# Patient Record
Sex: Female | Born: 1945 | Race: White | Hispanic: No | Marital: Married | Smoking: Never smoker
Health system: Southern US, Community
[De-identification: ages and names within clinical notes are randomized; demographics above are authoritative.]

## PROBLEM LIST (undated history)

## (undated) DIAGNOSIS — M75100 Unspecified rotator cuff tear or rupture of unspecified shoulder, not specified as traumatic: Secondary | ICD-10-CM

## (undated) DIAGNOSIS — M1990 Unspecified osteoarthritis, unspecified site: Secondary | ICD-10-CM

## (undated) DIAGNOSIS — E785 Hyperlipidemia, unspecified: Secondary | ICD-10-CM

## (undated) DIAGNOSIS — R413 Other amnesia: Secondary | ICD-10-CM

## (undated) DIAGNOSIS — K219 Gastro-esophageal reflux disease without esophagitis: Secondary | ICD-10-CM

## (undated) DIAGNOSIS — D649 Anemia, unspecified: Secondary | ICD-10-CM

## (undated) DIAGNOSIS — Z9889 Other specified postprocedural states: Secondary | ICD-10-CM

## (undated) DIAGNOSIS — H8110 Benign paroxysmal vertigo, unspecified ear: Secondary | ICD-10-CM

## (undated) DIAGNOSIS — F411 Generalized anxiety disorder: Secondary | ICD-10-CM

## (undated) DIAGNOSIS — Z860100 Personal history of colon polyps, unspecified: Secondary | ICD-10-CM

## (undated) DIAGNOSIS — M810 Age-related osteoporosis without current pathological fracture: Secondary | ICD-10-CM

## (undated) DIAGNOSIS — R519 Headache, unspecified: Secondary | ICD-10-CM

## (undated) HISTORY — PX: CARPAL TUNNEL RELEASE: SHX101

## (undated) HISTORY — PX: ABDOMINAL HYSTERECTOMY: SHX81

## (undated) HISTORY — PX: TONSILLECTOMY: SUR1361

## (undated) HISTORY — PX: OTHER SURGICAL HISTORY: SHX169

## (undated) HISTORY — PX: COLONOSCOPY: SHX174

---

## 2004-09-26 ENCOUNTER — Ambulatory Visit: Payer: Self-pay | Admitting: Unknown Physician Specialty

## 2005-02-17 ENCOUNTER — Emergency Department: Payer: Self-pay | Admitting: Emergency Medicine

## 2005-09-30 ENCOUNTER — Ambulatory Visit: Payer: Self-pay | Admitting: Unknown Physician Specialty

## 2005-11-26 ENCOUNTER — Ambulatory Visit: Payer: Self-pay | Admitting: Urology

## 2006-10-16 ENCOUNTER — Ambulatory Visit: Payer: Self-pay | Admitting: Unknown Physician Specialty

## 2007-01-03 IMAGING — CT CT PELVIS W/ CM
1 series · 16 of 29 positions shown, 20 images · non-contrast
Comparison: none

REASON FOR EXAM: pelvic pain
COMMENTS:

[Series 2: pelvis · axial · 0.74mm/px · z∈[-698,-490]mm · 16 of 29 slices shown, 20 images]
[im 2/29  soft-tissue]
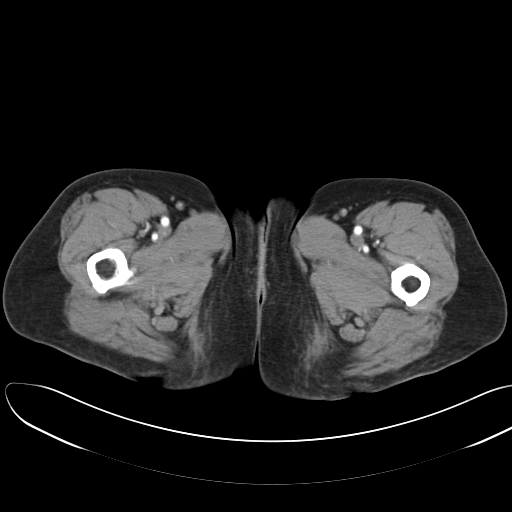
[im 2/29  bone]
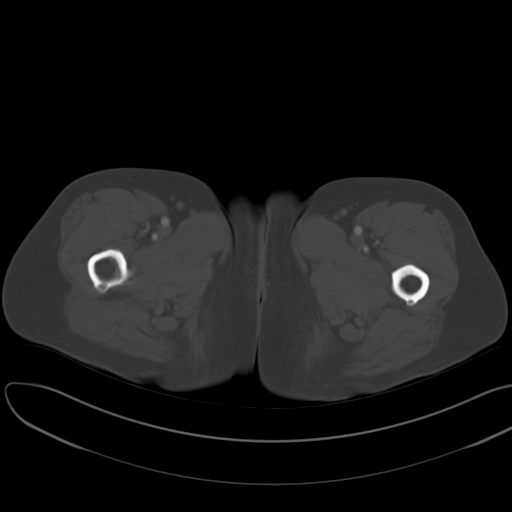
[im 4/29  soft-tissue]
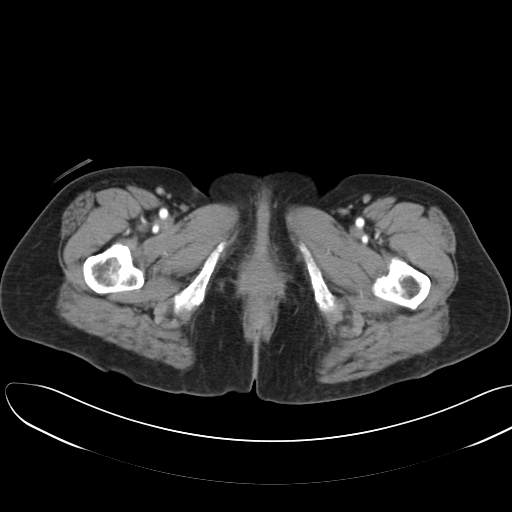
[im 6/29  soft-tissue]
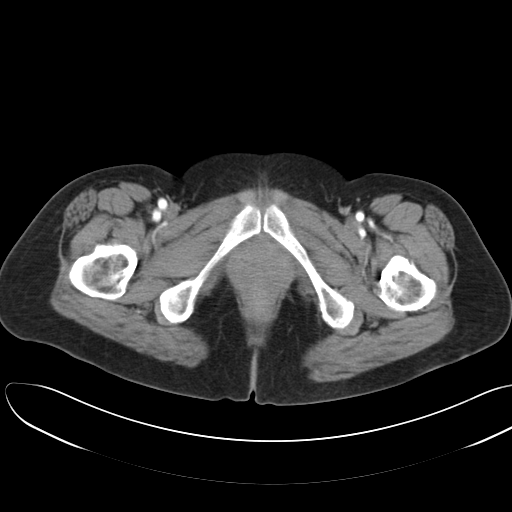
[im 8/29  soft-tissue]
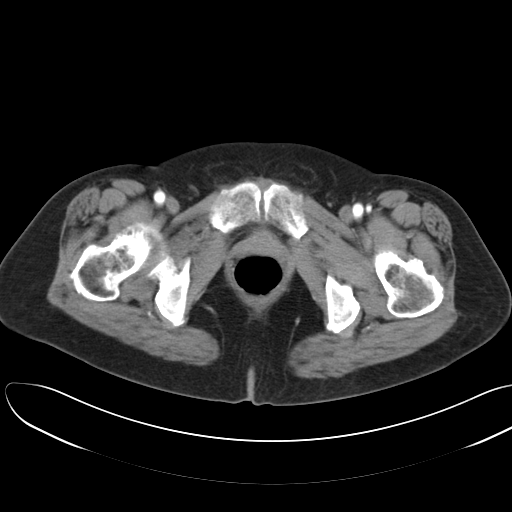
[im 10/29  soft-tissue]
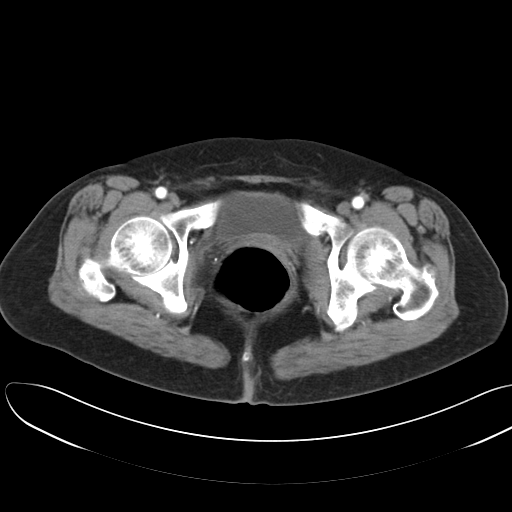
[im 12/29  soft-tissue]
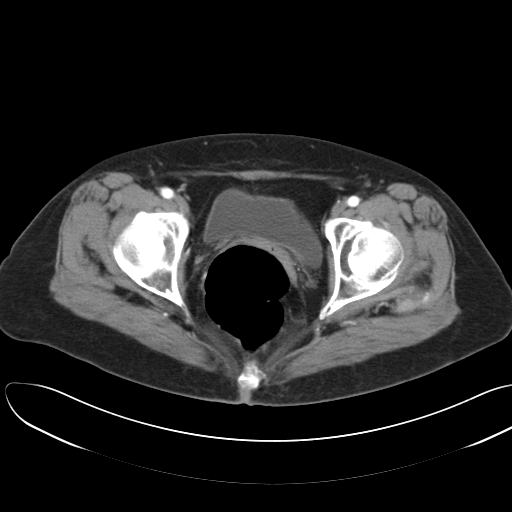
[im 14/29  soft-tissue]
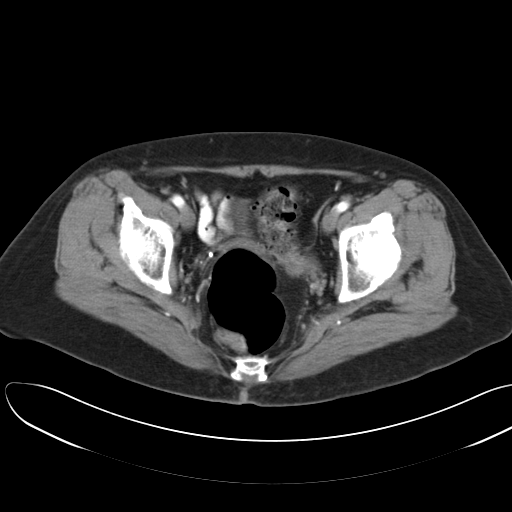
[im 16/29  soft-tissue]
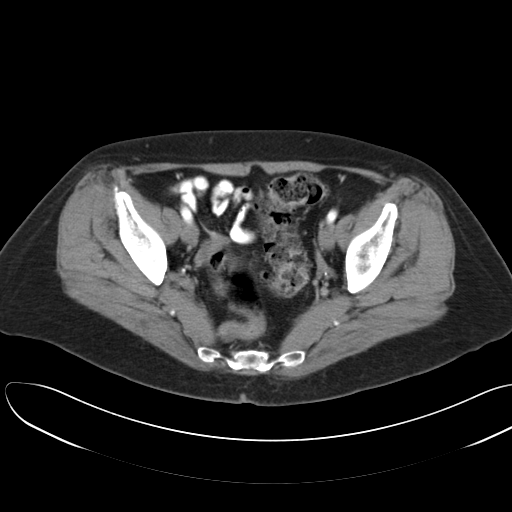
[im 18/29  soft-tissue]
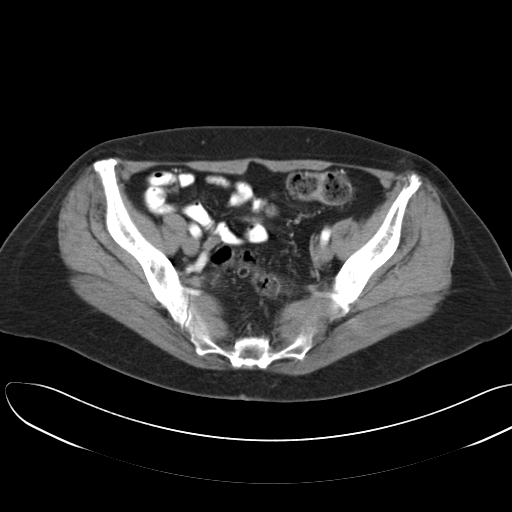
[im 18/29  bone]
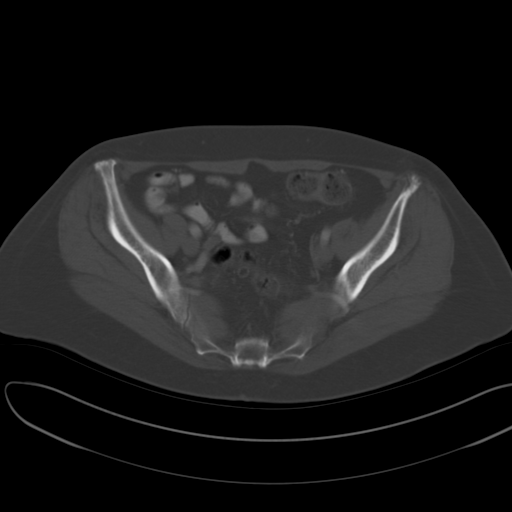
[im 20/29  soft-tissue]
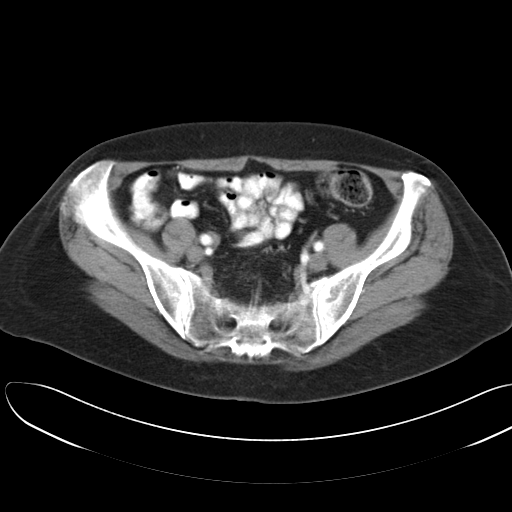
[im 22/29  soft-tissue]
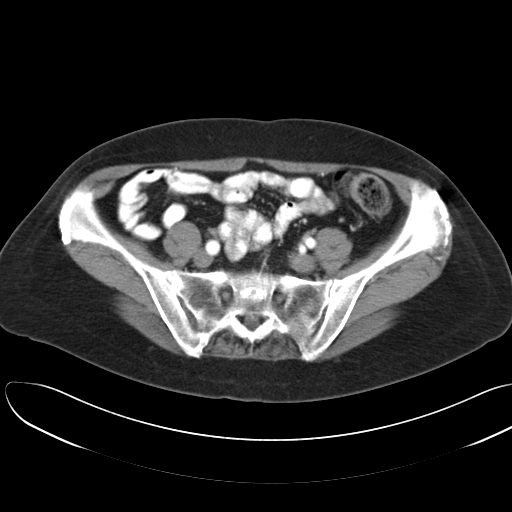
[im 24/29  soft-tissue]
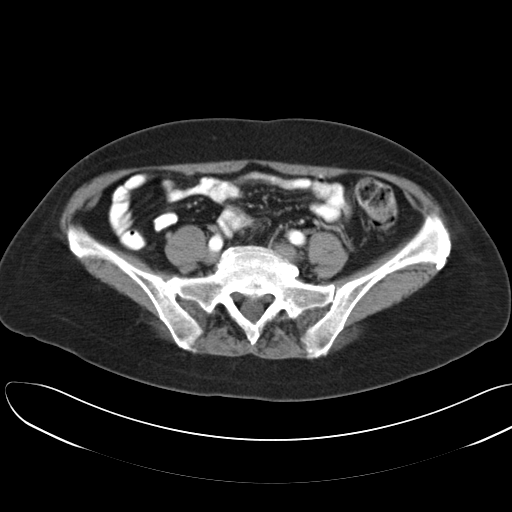
[im 25/29  lung]
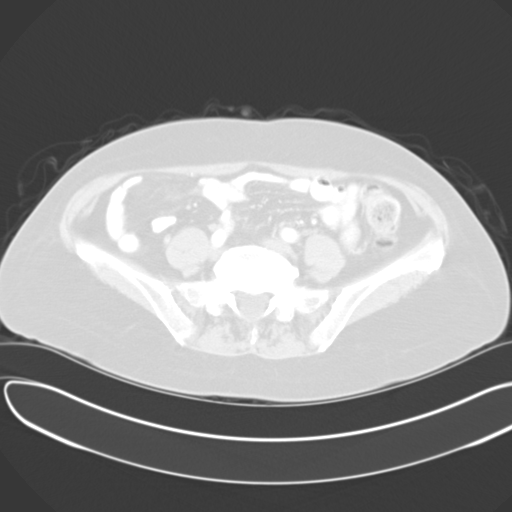
[im 26/29  soft-tissue]
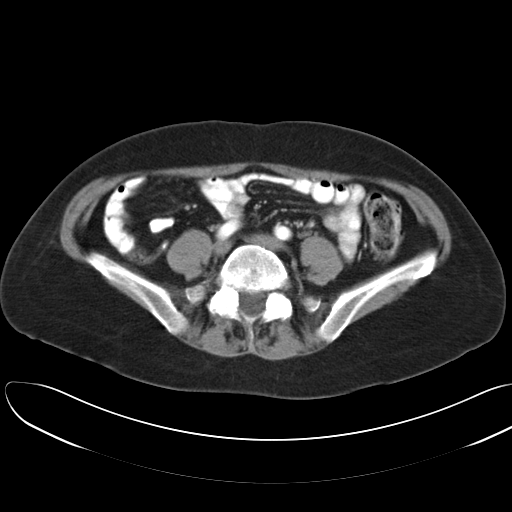
[im 26/29  lung]
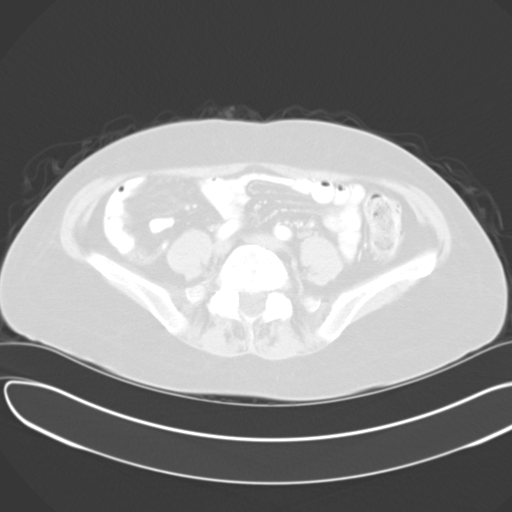
[im 27/29  lung]
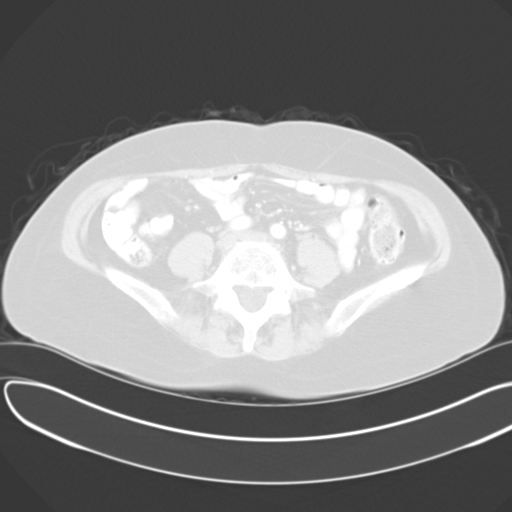
[im 28/29  soft-tissue]
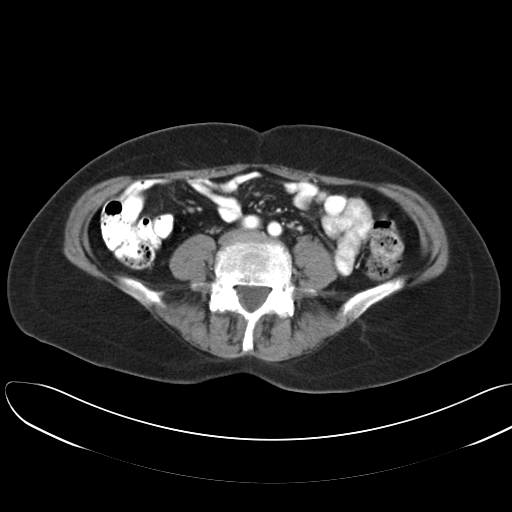
[im 28/29  lung]
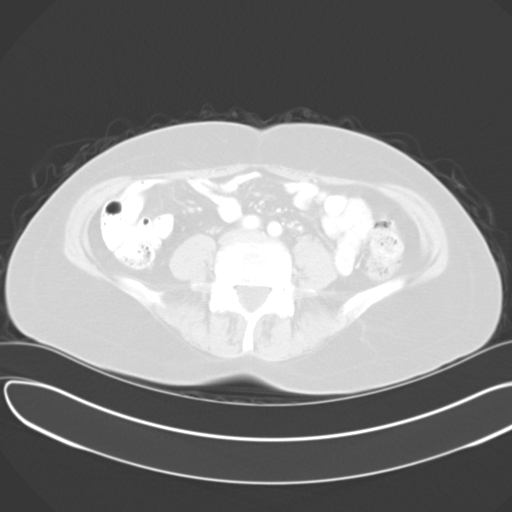

[16 of 29 positions shown; findings below may reference images not displayed]

PROCEDURE:     CT  - CT PELVIS STANDARD W  - November 26, 2005  [DATE]

RESULT:     Spiral 8 mm sections were obtained from the iliac crest through
the pubic symphysis status post intravenous administration of 100 ml of
Isovue 370 and oral contrast.

There does not appear to be evidence of pelvic free fluid, drainable
loculated fluid collections, masses or adenopathy.  Diverticulosis is
demonstrated within the sigmoid colon, mild to moderate, without CT evidence
of diverticulitis.
IMPRESSION: 1)Incidental note is made of diverticulosis within the sigmoid colon,
otherwise, no evidence of a pelvic free fluid, drainable loculated fluid
collections, masses or adenopathy.

## 2007-11-03 ENCOUNTER — Ambulatory Visit: Payer: Self-pay | Admitting: Unknown Physician Specialty

## 2007-11-17 ENCOUNTER — Ambulatory Visit: Payer: Self-pay | Admitting: Unknown Physician Specialty

## 2007-12-03 ENCOUNTER — Ambulatory Visit: Payer: Self-pay | Admitting: Unknown Physician Specialty

## 2008-04-15 ENCOUNTER — Ambulatory Visit: Payer: Self-pay | Admitting: Internal Medicine

## 2008-11-23 ENCOUNTER — Ambulatory Visit: Payer: Self-pay | Admitting: Unknown Physician Specialty

## 2009-11-26 ENCOUNTER — Ambulatory Visit: Payer: Self-pay | Admitting: Unknown Physician Specialty

## 2010-09-18 ENCOUNTER — Ambulatory Visit: Payer: Self-pay | Admitting: Unknown Physician Specialty

## 2010-12-24 ENCOUNTER — Ambulatory Visit: Payer: Self-pay | Admitting: Unknown Physician Specialty

## 2011-12-29 ENCOUNTER — Ambulatory Visit: Payer: Self-pay | Admitting: Unknown Physician Specialty

## 2013-10-21 ENCOUNTER — Ambulatory Visit: Payer: Self-pay | Admitting: Family Medicine

## 2013-11-03 ENCOUNTER — Ambulatory Visit: Payer: Self-pay | Admitting: Unknown Physician Specialty

## 2013-11-04 LAB — PATHOLOGY REPORT

## 2013-12-13 ENCOUNTER — Ambulatory Visit: Payer: Self-pay | Admitting: Family Medicine

## 2014-11-28 ENCOUNTER — Ambulatory Visit: Payer: Self-pay | Admitting: Family Medicine

## 2016-10-10 ENCOUNTER — Other Ambulatory Visit: Payer: Self-pay | Admitting: Family Medicine

## 2016-10-10 DIAGNOSIS — Z1231 Encounter for screening mammogram for malignant neoplasm of breast: Secondary | ICD-10-CM

## 2016-11-05 ENCOUNTER — Encounter (HOSPITAL_COMMUNITY): Payer: Self-pay

## 2016-11-05 ENCOUNTER — Ambulatory Visit
Admission: RE | Admit: 2016-11-05 | Discharge: 2016-11-05 | Disposition: A | Discharge status: Home / Self Care | Payer: Medicare Other | Source: Ambulatory Visit | Attending: Family Medicine | Admitting: Family Medicine

## 2016-11-05 DIAGNOSIS — Z1231 Encounter for screening mammogram for malignant neoplasm of breast: Secondary | ICD-10-CM | POA: Insufficient documentation

## 2017-11-10 ENCOUNTER — Other Ambulatory Visit: Payer: Self-pay | Admitting: Family Medicine

## 2017-11-10 DIAGNOSIS — Z1231 Encounter for screening mammogram for malignant neoplasm of breast: Secondary | ICD-10-CM

## 2017-12-01 ENCOUNTER — Encounter: Payer: Self-pay | Admitting: Radiology

## 2017-12-01 ENCOUNTER — Ambulatory Visit
Admission: RE | Admit: 2017-12-01 | Discharge: 2017-12-01 | Disposition: A | Discharge status: Home / Self Care | Payer: Medicare Other | Source: Ambulatory Visit | Attending: Family Medicine | Admitting: Family Medicine

## 2017-12-01 DIAGNOSIS — Z1231 Encounter for screening mammogram for malignant neoplasm of breast: Secondary | ICD-10-CM | POA: Insufficient documentation

## 2017-12-28 ENCOUNTER — Other Ambulatory Visit: Payer: Self-pay | Admitting: Unknown Physician Specialty

## 2017-12-28 DIAGNOSIS — M7541 Impingement syndrome of right shoulder: Secondary | ICD-10-CM

## 2018-01-04 ENCOUNTER — Ambulatory Visit
Admission: RE | Admit: 2018-01-04 | Discharge: 2018-01-04 | Disposition: A | Discharge status: Home / Self Care | Payer: Medicare Other | Source: Ambulatory Visit | Attending: Unknown Physician Specialty | Admitting: Unknown Physician Specialty

## 2018-01-04 DIAGNOSIS — X58XXXA Exposure to other specified factors, initial encounter: Secondary | ICD-10-CM | POA: Insufficient documentation

## 2018-01-04 DIAGNOSIS — S46811A Strain of other muscles, fascia and tendons at shoulder and upper arm level, right arm, initial encounter: Secondary | ICD-10-CM | POA: Insufficient documentation

## 2018-01-04 DIAGNOSIS — M7541 Impingement syndrome of right shoulder: Secondary | ICD-10-CM | POA: Insufficient documentation

## 2018-10-28 ENCOUNTER — Other Ambulatory Visit: Payer: Self-pay | Admitting: Family Medicine

## 2018-10-28 DIAGNOSIS — Z1231 Encounter for screening mammogram for malignant neoplasm of breast: Secondary | ICD-10-CM

## 2018-12-07 ENCOUNTER — Ambulatory Visit
Admission: RE | Admit: 2018-12-07 | Discharge: 2018-12-07 | Disposition: A | Discharge status: Home / Self Care | Payer: Medicare Other | Source: Ambulatory Visit | Attending: Family Medicine | Admitting: Family Medicine

## 2018-12-07 DIAGNOSIS — Z1231 Encounter for screening mammogram for malignant neoplasm of breast: Secondary | ICD-10-CM | POA: Diagnosis not present

## 2018-12-29 ENCOUNTER — Ambulatory Visit
Admission: RE | Admit: 2018-12-29 | Payer: Medicare Other | Source: Ambulatory Visit | Admitting: Unknown Physician Specialty

## 2018-12-29 ENCOUNTER — Encounter: Admission: RE | Payer: Self-pay | Source: Ambulatory Visit

## 2018-12-29 SURGERY — COLONOSCOPY WITH PROPOFOL
Anesthesia: General

## 2019-06-03 ENCOUNTER — Encounter: Payer: Self-pay | Admitting: *Deleted

## 2019-06-03 ENCOUNTER — Other Ambulatory Visit: Payer: Self-pay

## 2019-06-03 ENCOUNTER — Other Ambulatory Visit
Admission: RE | Admit: 2019-06-03 | Discharge: 2019-06-03 | Disposition: A | Discharge status: Home / Self Care | Payer: Medicare Other | Source: Ambulatory Visit | Attending: Internal Medicine | Admitting: Internal Medicine

## 2019-06-03 DIAGNOSIS — Z01812 Encounter for preprocedural laboratory examination: Secondary | ICD-10-CM | POA: Insufficient documentation

## 2019-06-03 DIAGNOSIS — Z20828 Contact with and (suspected) exposure to other viral communicable diseases: Secondary | ICD-10-CM | POA: Diagnosis not present

## 2019-06-03 LAB — SARS CORONAVIRUS 2 (TAT 6-24 HRS): SARS Coronavirus 2: NEGATIVE

## 2019-06-07 ENCOUNTER — Encounter: Payer: Self-pay | Admitting: *Deleted

## 2019-06-07 ENCOUNTER — Encounter
Admission: RE | Disposition: A | Discharge status: Home / Self Care | Payer: Self-pay | Source: Home / Self Care | Attending: Internal Medicine

## 2019-06-07 ENCOUNTER — Ambulatory Visit
Admission: RE | Admit: 2019-06-07 | Discharge: 2019-06-07 | Disposition: A | Discharge status: Home / Self Care | Payer: Medicare Other | Attending: Internal Medicine | Admitting: Internal Medicine

## 2019-06-07 ENCOUNTER — Ambulatory Visit: Payer: Medicare Other | Admitting: Anesthesiology

## 2019-06-07 DIAGNOSIS — Z1211 Encounter for screening for malignant neoplasm of colon: Secondary | ICD-10-CM | POA: Diagnosis not present

## 2019-06-07 DIAGNOSIS — M1990 Unspecified osteoarthritis, unspecified site: Secondary | ICD-10-CM | POA: Diagnosis not present

## 2019-06-07 DIAGNOSIS — Z888 Allergy status to other drugs, medicaments and biological substances status: Secondary | ICD-10-CM | POA: Diagnosis not present

## 2019-06-07 DIAGNOSIS — D120 Benign neoplasm of cecum: Secondary | ICD-10-CM | POA: Insufficient documentation

## 2019-06-07 DIAGNOSIS — K5730 Diverticulosis of large intestine without perforation or abscess without bleeding: Secondary | ICD-10-CM | POA: Diagnosis not present

## 2019-06-07 DIAGNOSIS — Z800 Family history of malignant neoplasm of digestive organs: Secondary | ICD-10-CM | POA: Diagnosis present

## 2019-06-07 DIAGNOSIS — Z885 Allergy status to narcotic agent status: Secondary | ICD-10-CM | POA: Insufficient documentation

## 2019-06-07 DIAGNOSIS — K641 Second degree hemorrhoids: Secondary | ICD-10-CM | POA: Diagnosis not present

## 2019-06-07 DIAGNOSIS — Z8719 Personal history of other diseases of the digestive system: Secondary | ICD-10-CM | POA: Diagnosis not present

## 2019-06-07 HISTORY — PX: COLONOSCOPY WITH PROPOFOL: SHX5780

## 2019-06-07 HISTORY — DX: Age-related osteoporosis without current pathological fracture: M81.0

## 2019-06-07 HISTORY — DX: Personal history of colon polyps, unspecified: Z86.0100

## 2019-06-07 HISTORY — DX: Unspecified rotator cuff tear or rupture of unspecified shoulder, not specified as traumatic: M75.100

## 2019-06-07 HISTORY — DX: Personal history of colonic polyps: Z86.010

## 2019-06-07 HISTORY — DX: Hyperlipidemia, unspecified: E78.5

## 2019-06-07 HISTORY — DX: Anemia, unspecified: D64.9

## 2019-06-07 HISTORY — DX: Unspecified osteoarthritis, unspecified site: M19.90

## 2019-06-07 HISTORY — DX: Benign paroxysmal vertigo, unspecified ear: H81.10

## 2019-06-07 SURGERY — COLONOSCOPY WITH PROPOFOL
Anesthesia: General

## 2019-06-07 MED ORDER — PROPOFOL 10 MG/ML IV BOLUS
INTRAVENOUS | Status: DC | PRN
  Administered 2019-06-07: 70 mg via INTRAVENOUS

## 2019-06-07 MED ORDER — SODIUM CHLORIDE 0.9 % IV SOLN
INTRAVENOUS | Status: DC
  Administered 2019-06-07: 1000 mL via INTRAVENOUS

## 2019-06-07 MED ORDER — PROPOFOL 500 MG/50ML IV EMUL
INTRAVENOUS | Status: DC | PRN
  Administered 2019-06-07: 140 ug/kg/min via INTRAVENOUS

## 2019-06-07 NOTE — Interval H&P Note (Signed)
History and Physical Interval Note:  06/07/2019 10:12 AM  Carmen Vazquez  has presented today for surgery, with the diagnosis of FAMILY HX.OF COLON CANCER,PERSONAL HX.OF COLON POLYPS.  The various methods of treatment have been discussed with the patient and family. After consideration of risks, benefits and other options for treatment, the patient has consented to  Procedure(s): COLONOSCOPY WITH PROPOFOL (N/A) as a surgical intervention.  The patient's history has been reviewed, patient examined, no change in status, stable for surgery.  I have reviewed the patient's chart and labs.  Questions were answered to the patient's satisfaction.     [place], [place]

## 2019-06-07 NOTE — Transfer of Care (Signed)
Immediate Anesthesia Transfer of Care Note  Patient: ALEEN SOUSA  Procedure(s) Performed: COLONOSCOPY WITH PROPOFOL (N/A )  Patient Location: PACU  Anesthesia Type:General  Level of Consciousness: sedated  Airway & Oxygen Therapy: Patient Spontanous Breathing and Patient connected to nasal cannula oxygen  Post-op Assessment: Report given to RN and Post -op Vital signs reviewed and stable  Post vital signs: Reviewed and stable  Last Vitals:  Vitals Value Taken Time  BP 103/59 06/07/19 1109  Temp 36.3 C 06/07/19 1109  Pulse 65 06/07/19 1109  Resp 14 06/07/19 1109  SpO2 100 % 06/07/19 1109  Vitals shown include unvalidated device data.  Last Pain:  Vitals:   06/07/19 1109  TempSrc:   PainSc: 0-No pain         Complications: No apparent anesthesia complications

## 2019-06-07 NOTE — Anesthesia Postprocedure Evaluation (Signed)
Anesthesia Post Note  Patient: Carmen Vazquez  Procedure(s) Performed: COLONOSCOPY WITH PROPOFOL (N/A )  Patient location during evaluation: PACU Anesthesia Type: General Level of consciousness: awake and alert and oriented Pain management: pain level controlled Vital Signs Assessment: post-procedure vital signs reviewed and stable Respiratory status: spontaneous breathing Cardiovascular status: blood pressure returned to baseline Anesthetic complications: no     Last Vitals:  Vitals:   06/07/19 1118 06/07/19 1128  BP: 117/67 133/73  Pulse: 78 70  Resp: 17 15  Temp:    SpO2: 100% 100%    Last Pain:  Vitals:   06/07/19 1118  TempSrc:   PainSc: 0-No pain                 Joy Haegele

## 2019-06-07 NOTE — H&P (Signed)
Outpatient short stay form Pre-procedure 06/07/2019 10:11 AM Carmen Vazquez K. Alice Reichert, M.D.  Primary Physician: Thereasa Distance, M.D.  Reason for visit:  Family history of colon cancer  History of present illness:  73 year old patient presenting for family history of colon cancer. Patient denies any change in bowel habits, rectal bleeding or involuntary weight loss.    Current Facility-Administered Medications:  .  0.9 %  sodium chloride infusion, , Intravenous, Continuous, Willette Mudry, Benay Pike, MD  Medications Prior to Admission  Medication Sig Dispense Refill Last Dose  . Cranberry 500 MG TABS Take 500 mg by mouth daily.     Marland Kitchen ibuprofen (ADVIL) 200 MG tablet Take 200 mg by mouth every 6 (six) hours as needed.     . Multiple Vitamin (MULTIVITAMIN) tablet Take 1 tablet by mouth daily.     . naproxen (NAPROSYN) 500 MG tablet Take 500 mg by mouth 2 (two) times daily with a meal.     . Omega-3 1000 MG CAPS Take 2,000 mg by mouth daily.     Marland Kitchen senna-docusate (SENOKOT-S) 8.6-50 MG tablet Take 1 tablet by mouth daily.        Allergies  Allergen Reactions  . Codeine Other (See Comments)  . Statins Other (See Comments)    MUSCLE PAIN      Past Medical History:  Diagnosis Date  . Anemia   . Arthritis   . History of colonic polyps   . Hyperlipidemia   . Osteoporosis   . Rotator cuff tear   . Vertigo, benign positional, unspecified laterality     Review of systems:  Otherwise negative.    Physical Exam  Gen: Alert, oriented. Appears stated age.  HEENT: [place]/AT. PERRLA. Lungs: CTA, no wheezes. CV: RR nl S1, S2. Abd: soft, benign, no masses. BS+ Ext: No edema. Pulses 2+    Planned procedures: Proceed with EGD and colonoscopy. The patient understands the nature of the planned procedure, indications, risks, alternatives and potential complications including but not limited to bleeding, infection, perforation, damage to internal organs and possible oversedation/side effects from  anesthesia. The patient agrees and gives consent to proceed.  Please refer to procedure notes for findings, recommendations and patient disposition/instructions.     Nahsir Venezia K. Alice Reichert, M.D. Gastroenterology 06/07/2019  10:11 AM

## 2019-06-07 NOTE — Anesthesia Post-op Follow-up Note (Signed)
Anesthesia QCDR form completed.        

## 2019-06-07 NOTE — Op Note (Signed)
[facility] Gastroenterology Patient Name: Carmen Vazquez Procedure Date: 06/07/2019 7:22 AM MRN: [id number] Account #: [id number] Date of Birth: 1946-07-19 Admit Type: Outpatient Age: 73 Room: [facility] ENDO ROOM 3 Gender: Female Note Status: Finalized Procedure:            Colonoscopy Indications:          Screening in patient at increased risk: Family history                        of 1st-degree relative with colorectal cancer Providers:            Benay Pike. Antrice Pal MD, MD Medicines:            Propofol per Anesthesia Complications:        No immediate complications. Procedure:            Pre-Anesthesia Assessment:                       - The risks and benefits of the procedure and the                        sedation options and risks were discussed with the                        patient. All questions were answered and informed                        consent was obtained.                       - Patient identification and proposed procedure were                        verified prior to the procedure by the nurse. The                        procedure was verified in the procedure room.                       - ASA Grade Assessment: III - A patient with severe                        systemic disease.                       - After reviewing the risks and benefits, the patient                        was deemed in satisfactory condition to undergo the                        procedure.                       After obtaining informed consent, the colonoscope was                        passed under direct vision. Throughout the procedure,                        the patient's blood pressure, pulse, and oxygen  saturations were monitored continuously. The                        Colonoscope was introduced through the anus and                        advanced to the the cecum, identified by appendiceal                        orifice and ileocecal valve. The  colonoscopy was                        performed without difficulty. The patient tolerated the                        procedure well. The quality of the bowel preparation                        was good. The ileocecal valve, appendiceal orifice, and                        rectum were photographed. Findings:      The perianal and digital rectal examinations were normal. Pertinent       negatives include normal sphincter tone and no palpable rectal lesions.      Multiple small and large-mouthed diverticula were found in the sigmoid       colon.      A 4 mm polyp was found in the cecum. The polyp was sessile. The polyp       was removed with a jumbo cold forceps. Resection and retrieval were       complete.      Non-bleeding internal hemorrhoids were found during retroflexion. The       hemorrhoids were Grade II (internal hemorrhoids that prolapse but reduce       spontaneously).      The exam was otherwise without abnormality. Impression:           - Diverticulosis in the sigmoid colon.                       - One 4 mm polyp in the cecum, removed with a jumbo                        cold forceps. Resected and retrieved.                       - Non-bleeding internal hemorrhoids.                       - The examination was otherwise normal. Recommendation:       - Patient has a contact number available for                        emergencies. The signs and symptoms of potential                        delayed complications were discussed with the patient.                        Return to normal activities tomorrow. Written discharge  instructions were provided to the patient.                       - Resume previous diet.                       - Continue present medications.                       - Repeat colonoscopy in 5 years for surveillance.                       - Return to GI office PRN.                       - The findings and recommendations were discussed with                         the patient. Procedure Code(s):    --- Professional ---                       [phone], Colonoscopy, flexible; with biopsy, single or                        multiple Diagnosis Code(s):    --- Professional ---                       K57.30, Diverticulosis of large intestine without                        perforation or abscess without bleeding                       K63.5, Polyp of colon                       K64.1, Second degree hemorrhoids                       Z80.0, Family history of malignant neoplasm of                        digestive organs CPT copyright 2019 [employer]. All rights reserved. The codes documented in this report are preliminary and upon coder review may  be revised to meet current compliance requirements. Efrain Sella MD, MD 06/07/2019 11:05:38 AM This report has been signed electronically. Number of Addenda: 0 Note Initiated On: 06/07/2019 7:22 AM Scope Withdrawal Time: 0 hours 6 minutes 30 seconds  Total Procedure Duration: 0 hours 11 minutes 35 seconds  Estimated Blood Loss: Estimated blood loss: none.      [facility]

## 2019-06-07 NOTE — Anesthesia Preprocedure Evaluation (Signed)
Anesthesia Evaluation  Patient identified by MRN, date of birth, ID band Patient awake    Reviewed: Allergy & Precautions, NPO status , Patient's Chart, lab work & pertinent test results  Airway Mallampati: III       Dental   Pulmonary neg pulmonary ROS,    Pulmonary exam normal        Cardiovascular negative cardio ROS Normal cardiovascular exam     Neuro/Psych negative psych ROS   GI/Hepatic negative GI ROS, Neg liver ROS,   Endo/Other  negative endocrine ROS  Renal/GU negative Renal ROS  negative genitourinary   Musculoskeletal  (+) Arthritis ,   Abdominal Normal abdominal exam  (+)   Peds negative pediatric ROS (+)  Hematology  (+) anemia ,   Anesthesia Other Findings   Reproductive/Obstetrics                             Anesthesia Physical Anesthesia Plan  ASA: II  Anesthesia Plan: General   Post-op Pain Management:    Induction: Intravenous  PONV Risk Score and Plan:   Airway Management Planned: Nasal Cannula  Additional Equipment:   Intra-op Plan:   Post-operative Plan:   Informed Consent: I have reviewed the patients History and Physical, chart, labs and discussed the procedure including the risks, benefits and alternatives for the proposed anesthesia with the patient or authorized representative who has indicated his/her understanding and acceptance.     Dental advisory given  Plan Discussed with: CRNA and Surgeon  Anesthesia Plan Comments:         Anesthesia Quick Evaluation

## 2019-06-08 ENCOUNTER — Encounter: Payer: Self-pay | Admitting: Internal Medicine

## 2019-06-08 LAB — SURGICAL PATHOLOGY

## 2021-12-27 ENCOUNTER — Other Ambulatory Visit: Payer: Self-pay | Admitting: Physician Assistant

## 2021-12-27 ENCOUNTER — Other Ambulatory Visit (HOSPITAL_COMMUNITY): Payer: Self-pay | Admitting: Physician Assistant

## 2021-12-27 DIAGNOSIS — F411 Generalized anxiety disorder: Secondary | ICD-10-CM

## 2021-12-27 DIAGNOSIS — R413 Other amnesia: Secondary | ICD-10-CM

## 2021-12-27 DIAGNOSIS — R2689 Other abnormalities of gait and mobility: Secondary | ICD-10-CM

## 2022-01-01 ENCOUNTER — Other Ambulatory Visit: Payer: Self-pay | Admitting: Physician Assistant

## 2022-01-01 DIAGNOSIS — F411 Generalized anxiety disorder: Secondary | ICD-10-CM

## 2022-01-01 DIAGNOSIS — R2689 Other abnormalities of gait and mobility: Secondary | ICD-10-CM

## 2022-01-01 DIAGNOSIS — R413 Other amnesia: Secondary | ICD-10-CM

## 2022-01-08 ENCOUNTER — Ambulatory Visit
Admission: RE | Admit: 2022-01-08 | Discharge: 2022-01-08 | Disposition: A | Discharge status: Home / Self Care | Payer: Medicare Other | Source: Ambulatory Visit | Attending: Physician Assistant | Admitting: Physician Assistant

## 2022-01-08 DIAGNOSIS — R413 Other amnesia: Secondary | ICD-10-CM | POA: Diagnosis present

## 2022-01-08 DIAGNOSIS — R2689 Other abnormalities of gait and mobility: Secondary | ICD-10-CM | POA: Insufficient documentation

## 2022-01-08 DIAGNOSIS — F411 Generalized anxiety disorder: Secondary | ICD-10-CM | POA: Diagnosis present

## 2022-02-14 ENCOUNTER — Other Ambulatory Visit: Payer: Self-pay

## 2022-02-14 ENCOUNTER — Emergency Department
Admission: EM | Admit: 2022-02-14 | Discharge: 2022-02-14 | Disposition: A | Discharge status: Home / Self Care | Payer: Medicare Other | Attending: Emergency Medicine | Admitting: Emergency Medicine

## 2022-02-14 ENCOUNTER — Emergency Department: Payer: Medicare Other

## 2022-02-14 DIAGNOSIS — N200 Calculus of kidney: Secondary | ICD-10-CM | POA: Diagnosis not present

## 2022-02-14 DIAGNOSIS — R1011 Right upper quadrant pain: Secondary | ICD-10-CM | POA: Diagnosis present

## 2022-02-14 LAB — CBC
HCT: 41.5 % (ref 36.0–46.0)
Hemoglobin: 13.2 g/dL (ref 12.0–15.0)
MCH: 30.8 pg (ref 26.0–34.0)
MCHC: 31.8 g/dL (ref 30.0–36.0)
MCV: 96.7 fL (ref 80.0–100.0)
Platelets: 303 10*3/uL (ref 150–400)
RBC: 4.29 MIL/uL (ref 3.87–5.11)
RDW: 11.9 % (ref 11.5–15.5)
WBC: 7.9 10*3/uL (ref 4.0–10.5)
nRBC: 0 % (ref 0.0–0.2)

## 2022-02-14 LAB — URINALYSIS, ROUTINE W REFLEX MICROSCOPIC
Bacteria, UA: NONE SEEN
Bilirubin Urine: NEGATIVE
Glucose, UA: NEGATIVE mg/dL
Ketones, ur: 20 mg/dL — AB
Leukocytes,Ua: NEGATIVE
Nitrite: NEGATIVE
Protein, ur: NEGATIVE mg/dL
Specific Gravity, Urine: 1.01 (ref 1.005–1.030)
Squamous Epithelial / HPF: NONE SEEN (ref 0–5)
pH: 7 (ref 5.0–8.0)

## 2022-02-14 LAB — TROPONIN I (HIGH SENSITIVITY): Troponin I (High Sensitivity): 3 ng/L (ref <18)

## 2022-02-14 LAB — BASIC METABOLIC PANEL
Anion gap: 12 (ref 5–15)
BUN: 22 mg/dL (ref 8–23)
CO2: 22 mmol/L (ref 22–32)
Calcium: 10.1 mg/dL (ref 8.9–10.3)
Chloride: 106 mmol/L (ref 98–111)
Creatinine, Ser: 0.74 mg/dL (ref 0.44–1.00)
GFR, Estimated: >60 mL/min (ref >60)
Glucose, Bld: 131 mg/dL — ABNORMAL HIGH (ref 70–99)
Potassium: 4.4 mmol/L (ref 3.5–5.1)
Sodium: 140 mmol/L (ref 135–145)

## 2022-02-14 LAB — HEPATIC FUNCTION PANEL
ALT: 13 U/L (ref 0–44)
AST: 33 U/L (ref 15–41)
Albumin: 4.1 g/dL (ref 3.5–5.0)
Alkaline Phosphatase: 84 U/L (ref 38–126)
Bilirubin, Direct: 0.4 mg/dL — ABNORMAL HIGH (ref 0.0–0.2)
Indirect Bilirubin: 1.5 mg/dL — ABNORMAL HIGH (ref 0.3–0.9)
Total Bilirubin: 1.9 mg/dL — ABNORMAL HIGH (ref 0.3–1.2)
Total Protein: 7.3 g/dL (ref 6.5–8.1)

## 2022-02-14 LAB — LIPASE, BLOOD: Lipase: 40 U/L (ref 11–51)

## 2022-02-14 MED ORDER — TAMSULOSIN HCL 0.4 MG PO CAPS: 0.4000 mg | ORAL_CAPSULE | Freq: Every day | ORAL | 0 refills | Status: DC

## 2022-02-14 MED ORDER — OXYCODONE HCL 5 MG PO TABS: 2.5000 mg | ORAL_TABLET | Freq: Four times a day (QID) | ORAL | 0 refills | Status: AC | PRN

## 2022-02-14 MED ORDER — FENTANYL CITRATE PF 50 MCG/ML IJ SOSY
50.0000 ug | PREFILLED_SYRINGE | INTRAMUSCULAR | Status: DC | PRN
  Administered 2022-02-14: 50 ug via INTRAVENOUS
  Filled 2022-02-14: qty 1

## 2022-02-14 MED ORDER — SODIUM CHLORIDE 0.9 % IV BOLUS
1000.0000 mL | Freq: Once | INTRAVENOUS | Status: AC
  Administered 2022-02-14: 1000 mL via INTRAVENOUS

## 2022-02-14 MED ORDER — KETOROLAC TROMETHAMINE 15 MG/ML IJ SOLN
15.0000 mg | Freq: Once | INTRAMUSCULAR | Status: AC
  Administered 2022-02-14: 15 mg via INTRAVENOUS
  Filled 2022-02-14: qty 1

## 2022-02-14 MED ORDER — ONDANSETRON 4 MG PO TBDP: 4.0000 mg | ORAL_TABLET | Freq: Three times a day (TID) | ORAL | 0 refills | Status: AC | PRN

## 2022-02-14 MED ORDER — IOHEXOL 300 MG/ML  SOLN
100.0000 mL | Freq: Once | INTRAMUSCULAR | Status: AC | PRN
  Administered 2022-02-14: 100 mL via INTRAVENOUS

## 2022-02-14 MED ORDER — ONDANSETRON HCL 4 MG/2ML IJ SOLN
4.0000 mg | Freq: Once | INTRAMUSCULAR | Status: AC
  Administered 2022-02-14: 4 mg via INTRAVENOUS
  Filled 2022-02-14: qty 2

## 2022-02-14 NOTE — ED Provider Notes (Signed)
[facility] Provider Note    Event Date/Time   First MD Initiated Contact with Patient 02/14/22 1412     (approximate)   History   Flank Pain and Abdominal Pain   HPI  Carmen Vazquez is a 76 y.o. female who comes in with concern for right upper quadrant abdominal pain.  Patient reports some right upper quadrant pain that started yesterday with some associated nausea and vomiting.  She does not have any increased dysuria but does report a little bit of frequency where if she feels that she can get her urine out.  Denies ever having this previously.  Denies any chest pain, shortness of breath.     Physical Exam   Triage Vital Signs: ED Triage Vitals  Enc Vitals Group     BP 02/14/22 1247 (!) 154/73     Pulse Rate 02/14/22 1247 65     Resp 02/14/22 1247 18     Temp 02/14/22 1247 97.7 F (36.5 C)     Temp Source 02/14/22 1247 Oral     SpO2 02/14/22 1247 95 %     Weight 02/14/22 1237 126 lb (57.2 kg)     Height 02/14/22 1237 '5\' 4"'$  (1.626 m)     Head Circumference --      Peak Flow --      Pain Score 02/14/22 1237 8     Pain Loc --      Pain Edu? --      Excl. in [place]? --     Most recent vital signs: Vitals:   02/14/22 1247  BP: (!) 154/73  Pulse: 65  Resp: 18  Temp: 97.7 F (36.5 C)  SpO2: 95%     General: Awake, no distress.  CV:  Good peripheral perfusion.  Resp:  Normal effort.  Abd:  No distention.  Right upper quadrant tenderness Other:     ED Results / Procedures / Treatments   Labs (all labs ordered are listed, but only abnormal results are displayed) Labs Reviewed  BASIC METABOLIC PANEL - Abnormal; Notable for the following components:      Result Value   Glucose, Bld 131 (*)    All other components within normal limits  CBC  LIPASE, BLOOD  URINALYSIS, ROUTINE W REFLEX MICROSCOPIC  HEPATIC FUNCTION PANEL  TROPONIN I (HIGH SENSITIVITY)     EKG  My interpretation of EKG:  Sinus rhythm rate of 66 without any  ST elevation or T wave inversions, normal intervals  RADIOLOGY I have reviewed the CT personally and interpreted and there is kidney stone.     PROCEDURES:  Critical Care performed: No  Procedures   MEDICATIONS ORDERED IN ED: Medications  fentaNYL (SUBLIMAZE) injection 50 mcg (50 mcg Intravenous Given 02/14/22 1301)  ondansetron (ZOFRAN) injection 4 mg (4 mg Intravenous Given 02/14/22 1300)  sodium chloride 0.9 % bolus 1,000 mL (1,000 mLs Intravenous New Bag/Given 02/14/22 1305)     IMPRESSION / MDM / ASSESSMENT AND PLAN / ED COURSE  I reviewed the triage vital signs and the nursing notes.  This concerning for acute life-threatening pathology such as differential including: cholecystitis, obstructive kidney stone, ACS.  No shortness of breath and not hypoxic so unlikely PE.  Patient already got IV fentanyl IV Zofran in the waiting room  BMP normal.  CBC normal.  Lipase normal.  UA with some ketones but patient got IV fluid.  No WBCs.  BMP normal CBC normal lipase normal hepatic function slightly elevated  and she can follow this outpatient with her primary doctor.  CT imaging concerning for an obstructing right kidney stone that is 3 mm.  Discussed with patient indications for returning including fevers.  However patient reports her pain is well controlled at this time and is requesting discharge home.  She has not had any nausea or vomiting at this time.  We discussed home medications and following up with urology as needed.   Considered admission for patient but given the her symptoms are noncardiac in nature and she has a kidney stone that without evidence of infection patient feels comfortable going home.  We discussed trying a smaller dose of oxycodone and to be careful as it can cause dizziness and falls and she expressed understanding.  We also discussed that Flomax can sometimes lower blood pressure as well and to keep an eye on her blood pressure.  She expressed understanding  felt comfortable with plan     FINAL CLINICAL IMPRESSION(S) / ED DIAGNOSES   Final diagnoses:  Kidney stone     Rx / DC Orders   ED Discharge Orders     None        Note:  This document was prepared using Dragon voice recognition software and may include unintentional dictation errors.   Vanessa [place], MD 02/14/22 [phone]

## 2022-02-14 NOTE — ED Notes (Signed)
Pt bladder scanned- scanner reading 158 ml's urine. Pt states she was able to urinate after the ultrasound exam.

## 2022-02-14 NOTE — ED Notes (Signed)
Ultrasound with pt

## 2022-02-14 NOTE — Discharge Instructions (Addendum)
You have a kidney stone. See report below.   Take ibuprofen '600mg'$  every 8 hours daily (as long as you are not on any other blood thinners or have kidney disease) or you can use the naproxen  Take tylenol 1g every 8 hours daily. Take oxycodone for breakthrough pain. Do not drive, work, or operate machinery while on this. Start off with half a pill as it can cause weakness/dizzyness Take zofran to help with nausea. Take Flomax to help dilate uretha. Call urology number above to schedule outpatient appointment. Return to ED for fevers, unable to keep food down, or any other concerns.     1. Obstructing calculus in the distal RIGHT ureter at the  vesicoureteral junction.  2. Enhancement of the RIGHT ureter epithelium consistent with  infection or inflammation of the RIGHT ureter. Mild pelvicaliectasis  and hydroureter on the RIGHT.  3. Benign Bosniak 1 renal cyst of the RIGHT kidney  4. Stable hepatic hypodensities not changed from 2011.  5. LEFT colon diverticulosis without evidence of diverticulitis        Take oxycodone as prescribed. Do not drink alcohol, drive or participate in any other potentially dangerous activities while taking this medication as it may make you sleepy. Do not take this medication with any other sedating medications, either prescription or over-the-counter. If you were prescribed Percocet or Vicodin, do not take these with acetaminophen (Tylenol) as it is already contained within these medications.  This medication is an opiate (or narcotic) pain medication and can be habit forming. Use it as little as possible to achieve adequate pain control. Do not use or use it with extreme caution if you have a history of opiate abuse or dependence. If you are on a pain contract with your primary care doctor or a pain specialist, be sure to let them know you were prescribed this medication today from the Emergency Department. This medication is intended for your use only - do not  give any to anyone else and keep it in a secure place where nobody else, especially children, have access to it.

## 2022-02-14 NOTE — ED Notes (Signed)
See triage note. Pt reports couldn't hold her urine in last few days but as of today cannot urinate when she has the sensation to do so. Denies history of kidney stones; denies back pain & fever, reports nausea intermittently; pt calmly laying on stretcher with arm grasping at abdomen, skin dry, resp reg/unlabored but shallow due to pain; no known injury to abdomen per pt. Visitor at bedside.

## 2022-02-14 NOTE — ED Triage Notes (Signed)
Pt to ED for flank pain or RUQ abdominal pain radiating to R back since yesterday. States has had urinary frequency (urge to urinate with no warning) but no dysuria or hematuria. Pt vomited this morning.  Pt appears uncomfortable. Obtaining ekg vs etc.

## 2022-02-14 NOTE — ED Notes (Signed)
Called lab to add on hepatic func panel and trop. Lab staff stated they'll do so now.

## 2022-02-14 NOTE — ED Notes (Signed)
Pt in significant pain. Pt needed to stand up. IV placed and blood drawn with pt standing. EKG already shown to doctor. Pt actively vomiting.

## 2022-02-19 ENCOUNTER — Ambulatory Visit (INDEPENDENT_AMBULATORY_CARE_PROVIDER_SITE_OTHER): Payer: Medicare Other | Admitting: Urology

## 2022-02-19 ENCOUNTER — Encounter: Payer: Self-pay | Admitting: Urology

## 2022-02-19 VITALS — BP 118/68 | HR 80 | Ht 64.0 in | Wt 126.0 lb

## 2022-02-19 DIAGNOSIS — N132 Hydronephrosis with renal and ureteral calculous obstruction: Secondary | ICD-10-CM | POA: Diagnosis not present

## 2022-02-19 DIAGNOSIS — N23 Unspecified renal colic: Secondary | ICD-10-CM

## 2022-02-19 DIAGNOSIS — N201 Calculus of ureter: Secondary | ICD-10-CM | POA: Diagnosis not present

## 2022-02-19 DIAGNOSIS — N200 Calculus of kidney: Secondary | ICD-10-CM

## 2022-02-19 MED ORDER — TAMSULOSIN HCL 0.4 MG PO CAPS
0.4000 mg | ORAL_CAPSULE | Freq: Every day | ORAL | 0 refills | Status: AC
Start: 2022-02-19 — End: 2022-03-05

## 2022-02-19 NOTE — Progress Notes (Signed)
02/19/2022 3:32 PM   [name] 04-22-1946 [id number]  Referring provider: Sofie Hartigan, MD [place],  [place] [address]  Chief Complaint  Patient presents with   Nephrolithiasis    HPI: Carmen Vazquez is a 76 y.o. female who presents in follow-up of recent ED visit for renal colic.  Seen [facility] ED 02/14/2022 with 24-hour history of progressively worsening right lower quadrant abdominal pain which radiated to the lateral costal margin.  Pain severe without identifiable precipitating, aggravating or alleviating factors Positive nausea/vomiting Mild urinary frequency No fever or chills UA with microhematuria Stone protocol CT showed a 3 mm right UVJ calculus with mild hydronephrosis/hydroureter Pain was controlled with parenteral analgesics and she was discharged on oxycodone and tamsulosin Since ED visit denies recurrent severe pain though has noted increased frequency, urgency with occasional episodes of urge incontinence and mild pelvic pressure   PMH: Past Medical History:  Diagnosis Date   Anemia    Arthritis    History of colonic polyps    Hyperlipidemia    Osteoporosis    Rotator cuff tear    Vertigo, benign positional, unspecified laterality     Surgical History: Past Surgical History:  Procedure Laterality Date   ABDOMINAL HYSTERECTOMY     BCC REMOVAL     CARPAL TUNNEL RELEASE Right    COLONOSCOPY     COLONOSCOPY WITH PROPOFOL N/A 06/07/2019   Procedure: COLONOSCOPY WITH PROPOFOL;  Surgeon: Toledo, Benay Pike, MD;  Location: [facility] ENDOSCOPY;  Service: Gastroenterology;  Laterality: N/A;   ROTATOR CUFF SURGERY     THUMB TENDON RELEASE Right    TONSILLECTOMY      Home Medications:  Allergies as of 02/19/2022       Reactions   Codeine Other (See Comments)   Statins Other (See Comments)   MUSCLE PAIN        Medication List        Accurate as of Feb 19, 2022  3:32 PM. If you have any questions, ask your nurse or doctor.           Cranberry 500 MG Tabs Take 500 mg by mouth daily.   ibuprofen 200 MG tablet Commonly known as: ADVIL Take 200 mg by mouth every 6 (six) hours as needed.   multivitamin tablet Take 1 tablet by mouth daily.   naproxen 500 MG tablet Commonly known as: NAPROSYN Take 500 mg by mouth 2 (two) times daily with a meal.   Omega-3 1000 MG Caps Take 2,000 mg by mouth daily.   ondansetron 4 MG disintegrating tablet Commonly known as: ZOFRAN-ODT Take 1 tablet (4 mg total) by mouth every 8 (eight) hours as needed for up to 7 days for nausea or vomiting.   oxyCODONE 5 MG immediate release tablet Commonly known as: Roxicodone Take 0.5 tablets (2.5 mg total) by mouth every 6 (six) hours as needed for up to 5 days.   senna-docusate 8.6-50 MG tablet Commonly known as: Senokot-S Take 1 tablet by mouth daily.   tamsulosin 0.4 MG Caps capsule Commonly known as: FLOMAX Take 1 capsule (0.4 mg total) by mouth daily for 10 days. Take until stone passes then stop        Allergies:  Allergies  Allergen Reactions   Codeine Other (See Comments)   Statins Other (See Comments)    MUSCLE PAIN     Family History: Family History  Problem Relation Age of Onset   Breast cancer Maternal Aunt  Social History:  reports that she has never smoked. She has never used smokeless tobacco. She reports that she does not drink alcohol and does not use drugs.   Physical Exam: BP 118/68   Pulse 80   Ht _0  (1.626 m)   Wt 126 lb (57.2 kg)   BMI 21.63 kg/m   Constitutional:  Alert and oriented, No acute distress. HEENT: [place] AT, moist mucus membranes.  Trachea midline, no masses. Cardiovascular: No clubbing, cyanosis, or edema. Respiratory: Normal respiratory effort, no increased work of breathing. GI: Abdomen is soft, nontender, nondistended, no abdominal masses GU: No CVA tenderness Skin: No rashes, bruises or suspicious lesions. Neurologic: Grossly intact, no focal deficits, moving all  4 extremities. Psychiatric: Normal mood and affect.  Laboratory Data:  Urinalysis Micro: Negative WBC/RBC; calcium oxalate crystals   Pertinent Imaging: CT images were personally reviewed and interpreted   Assessment & Plan:    1.  Right ureteral calculus We discussed various treatment options for urolithiasis including observation with or without medical expulsive therapy, shockwave lithotripsy (SWL), ureteroscopy and laser lithotripsy with stent placement. We discussed that management is based on stone size, location, density, patient co-morbidities, and patient preference.  Stones <67m in size have a >80% spontaneous passage rate. Data surrounding the use of tamsulosin for medical expulsive therapy is controversial, but meta analyses suggests it is most efficacious for distal stones between 5-133min size. Possible side effects include dizziness/lightheadedness, and retrograde ejaculation. SWL has a lower stone free rate in a single procedure, but also a lower complication rate compared to ureteroscopy and avoids a stent and associated stent related symptoms. Possible complications include renal hematoma, steinstrasse, and need for additional treatment. Ureteroscopy with laser lithotripsy and stent placement has a higher stone free rate than SWL in a single procedure, however increased complication rate including possible infection, ureteral injury, bleeding, and stent related morbidity. Common stent related symptoms include dysuria, urgency/frequency, and flank pain. After an extensive discussion of the risks and benefits of the above treatment options, the patient would like to proceed with MET. Follow-up 1 week with KUB.  Instructed to call for worsening pain or development of fever greater than 101 degrees.   ScAbbie SonsMDBluffdale[address] Hill Field CourtSuRushforduPetersburgNC [phone]

## 2022-02-20 ENCOUNTER — Encounter: Payer: Self-pay | Admitting: Urology

## 2022-02-20 LAB — URINALYSIS, COMPLETE
Bilirubin, UA: NEGATIVE
Glucose, UA: NEGATIVE
Leukocytes,UA: NEGATIVE
Nitrite, UA: NEGATIVE
Protein,UA: NEGATIVE
RBC, UA: NEGATIVE
Specific Gravity, UA: >1.03 — ABNORMAL HIGH (ref 1.005–1.030)
Urobilinogen, Ur: 0.2 mg/dL (ref 0.2–1.0)
pH, UA: 5.5 (ref 5.0–7.5)

## 2022-02-20 LAB — MICROSCOPIC EXAMINATION: Bacteria, UA: NONE SEEN

## 2022-02-28 ENCOUNTER — Ambulatory Visit
Admission: RE | Admit: 2022-02-28 | Discharge: 2022-02-28 | Disposition: A | Discharge status: Home / Self Care | Payer: Medicare Other | Attending: Urology | Admitting: Urology

## 2022-02-28 ENCOUNTER — Ambulatory Visit
Admission: RE | Admit: 2022-02-28 | Discharge: 2022-02-28 | Disposition: A | Discharge status: Home / Self Care | Payer: Medicare Other | Source: Ambulatory Visit | Attending: Urology | Admitting: Urology

## 2022-02-28 ENCOUNTER — Encounter: Payer: Self-pay | Admitting: Urology

## 2022-02-28 ENCOUNTER — Ambulatory Visit (INDEPENDENT_AMBULATORY_CARE_PROVIDER_SITE_OTHER): Payer: Medicare Other | Admitting: Urology

## 2022-02-28 VITALS — BP 92/59 | HR 85 | Ht 64.0 in | Wt 126.0 lb

## 2022-02-28 DIAGNOSIS — N201 Calculus of ureter: Secondary | ICD-10-CM | POA: Diagnosis not present

## 2022-02-28 DIAGNOSIS — N132 Hydronephrosis with renal and ureteral calculous obstruction: Secondary | ICD-10-CM

## 2022-02-28 LAB — MICROSCOPIC EXAMINATION: Epithelial Cells (non renal): >10 /hpf — AB (ref 0–10)

## 2022-02-28 LAB — URINALYSIS, COMPLETE
Bilirubin, UA: NEGATIVE
Glucose, UA: NEGATIVE
Ketones, UA: NEGATIVE
Leukocytes,UA: NEGATIVE
Nitrite, UA: NEGATIVE
RBC, UA: NEGATIVE
Specific Gravity, UA: 1.03 (ref 1.005–1.030)
Urobilinogen, Ur: 0.2 mg/dL (ref 0.2–1.0)
pH, UA: 6 (ref 5.0–7.5)

## 2022-02-28 NOTE — Progress Notes (Signed)
02/28/2022 11:03 AM   Carmen Vazquez [birth year] [id number]  Referring provider: Sofie Hartigan, MD [place],  [place] [address]  Chief Complaint  Patient presents with   Nephrolithiasis    HPI: Carmen Vazquez is a 76 y.o. female who presents for follow-up.  Initially seen 02/19/2022.  Refer to my previous note for details. Her symptoms have resolved though she is not aware of passing a stone Has been complaining of weakness and dizziness which she states started with her stone diagnosis.  Remains on tamsulosin.   PMH: Past Medical History:  Diagnosis Date   Anemia    Arthritis    History of colonic polyps    Hyperlipidemia    Osteoporosis    Rotator cuff tear    Vertigo, benign positional, unspecified laterality     Surgical History: Past Surgical History:  Procedure Laterality Date   ABDOMINAL HYSTERECTOMY     BCC REMOVAL     CARPAL TUNNEL RELEASE Right    COLONOSCOPY     COLONOSCOPY WITH PROPOFOL N/A 06/07/2019   Procedure: COLONOSCOPY WITH PROPOFOL;  Surgeon: Toledo, Benay Pike, MD;  Location: [facility] ENDOSCOPY;  Service: Gastroenterology;  Laterality: N/A;   ROTATOR CUFF SURGERY     THUMB TENDON RELEASE Right    TONSILLECTOMY      Home Medications:  Allergies as of 02/28/2022       Reactions   Codeine Other (See Comments)   Statins Other (See Comments)   MUSCLE PAIN        Medication List        Accurate as of February 28, 2022 11:03 AM. If you have any questions, ask your nurse or doctor.          Cranberry 500 MG Tabs Take 500 mg by mouth daily.   ibuprofen 200 MG tablet Commonly known as: ADVIL Take 200 mg by mouth every 6 (six) hours as needed.   multivitamin tablet Take 1 tablet by mouth daily.   naproxen 500 MG tablet Commonly known as: NAPROSYN Take 500 mg by mouth 2 (two) times daily with a meal.   Omega-3 1000 MG Caps Take 2,000 mg by mouth daily.   senna-docusate 8.6-50 MG tablet Commonly known as:  Senokot-S Take 1 tablet by mouth daily.   tamsulosin 0.4 MG Caps capsule Commonly known as: FLOMAX Take 1 capsule (0.4 mg total) by mouth daily for 14 days. Take until stone passes then stop        Allergies:  Allergies  Allergen Reactions   Codeine Other (See Comments)   Statins Other (See Comments)    MUSCLE PAIN     Family History: Family History  Problem Relation Age of Onset   Breast cancer Maternal Aunt     Social History:  reports that she has never smoked. She has never used smokeless tobacco. She reports that she does not drink alcohol and does not use drugs.   Physical Exam: BP (!) 92/59   Pulse 85   Ht '5\' 4"'$  (1.626 m)   Wt 126 lb (57.2 kg)   BMI 21.63 kg/m   Constitutional:  Alert and oriented, No acute distress. HEENT: [place] AT Psychiatric: Normal mood and affect.  Laboratory Data:  Urinalysis Micro: Negative WBC/RBC; >10 epi   Pertinent Imaging: Images of a KUB performed today were personally reviewed and interpreted.  No calcifications suspicious for a distal ureteral stone are identified   Assessment & Plan:   She has most likely  passed the right UVJ calculus Orthostatic symptoms secondary to tamsulosin and she was instructed to discontinue Schedule follow-up renal ultrasound to document resolution or persistence of pelviectasis/hydroureter    Abbie Sons, MD  [facility] [address] [place], [place] [address] [phone]

## 2022-03-18 ENCOUNTER — Ambulatory Visit
Admission: RE | Admit: 2022-03-18 | Discharge: 2022-03-18 | Disposition: A | Discharge status: Home / Self Care | Payer: Medicare Other | Source: Ambulatory Visit | Attending: Urology | Admitting: Urology

## 2022-03-18 DIAGNOSIS — N132 Hydronephrosis with renal and ureteral calculous obstruction: Secondary | ICD-10-CM | POA: Diagnosis not present

## 2022-03-20 ENCOUNTER — Telehealth: Payer: Self-pay | Admitting: Urology

## 2022-03-20 DIAGNOSIS — N132 Hydronephrosis with renal and ureteral calculous obstruction: Secondary | ICD-10-CM

## 2022-03-20 DIAGNOSIS — N201 Calculus of ureter: Secondary | ICD-10-CM

## 2022-03-20 DIAGNOSIS — N1330 Unspecified hydronephrosis: Secondary | ICD-10-CM

## 2022-03-20 NOTE — Telephone Encounter (Signed)
Renal ultrasound showed persistent dilation of the urine collecting portion of the right kidney which may indicate the stone is still present.  Recommend scheduling follow-up CT to see if the stone is still present.  This will be a noncontrast CT of the pelvis.  Order was entered and will call with results

## 2022-03-27 ENCOUNTER — Ambulatory Visit
Admission: RE | Admit: 2022-03-27 | Discharge: 2022-03-27 | Disposition: A | Discharge status: Home / Self Care | Payer: Medicare Other | Source: Ambulatory Visit | Attending: Urology | Admitting: Urology

## 2022-03-27 DIAGNOSIS — N132 Hydronephrosis with renal and ureteral calculous obstruction: Secondary | ICD-10-CM | POA: Diagnosis present

## 2022-03-31 ENCOUNTER — Telehealth: Payer: Self-pay | Admitting: *Deleted

## 2022-03-31 NOTE — Telephone Encounter (Signed)
Spoke with patient and advised results   

## 2022-03-31 NOTE — Telephone Encounter (Signed)
CT shows that she has passed the distal right ureteral stone, great news. She may follow up as needed.

## 2022-03-31 NOTE — Telephone Encounter (Signed)
Pt calling asking for the results of her CT pelvis. Please advise.

## 2022-04-02 ENCOUNTER — Encounter: Payer: Self-pay | Admitting: *Deleted

## 2022-04-17 ENCOUNTER — Ambulatory Visit (INDEPENDENT_AMBULATORY_CARE_PROVIDER_SITE_OTHER): Payer: Medicare Other | Admitting: Urology

## 2022-04-17 ENCOUNTER — Encounter: Payer: Self-pay | Admitting: Urology

## 2022-04-17 VITALS — BP 106/66 | HR 84 | Wt 124.0 lb

## 2022-04-17 DIAGNOSIS — R300 Dysuria: Secondary | ICD-10-CM

## 2022-04-17 DIAGNOSIS — N3001 Acute cystitis with hematuria: Secondary | ICD-10-CM | POA: Diagnosis not present

## 2022-04-17 LAB — URINALYSIS, COMPLETE
Bilirubin, UA: NEGATIVE
Glucose, UA: NEGATIVE
Ketones, UA: NEGATIVE
Nitrite, UA: NEGATIVE
Specific Gravity, UA: 1.015 (ref 1.005–1.030)
Urobilinogen, Ur: 0.2 mg/dL (ref 0.2–1.0)
pH, UA: 5.5 (ref 5.0–7.5)

## 2022-04-17 LAB — MICROSCOPIC EXAMINATION
RBC, Urine: >30 /hpf — AB (ref 0–2)
WBC, UA: >30 /hpf — AB (ref 0–5)

## 2022-04-17 LAB — BLADDER SCAN AMB NON-IMAGING: Scan Result: 18

## 2022-04-17 MED ORDER — SULFAMETHOXAZOLE-TRIMETHOPRIM 800-160 MG PO TABS: 1.0000 | ORAL_TABLET | Freq: Two times a day (BID) | ORAL | 0 refills | Status: DC

## 2022-04-17 MED ORDER — URIBEL 118 MG PO CAPS: 118.0000 mg | ORAL_CAPSULE | ORAL | 0 refills | Status: DC | PRN

## 2022-04-17 NOTE — Progress Notes (Signed)
04/17/2022 1:31 PM   Carmen Vazquez [birth year] [id number]  Referring provider: Sofie Hartigan, [place],  [place] [address]  Chief Complaint  Patient presents with   Dysuria    HPI: 76 y.o. female called for an acute visit this morning for lower urinary tract symptoms and pain.  Initially seen 0/17/7939 for renal colic secondary to a 3 mm right UVJ calculus At the time of that visit she was asymptomatic but not aware of passing the stone.  No calculus was visualized on KUB and a follow-up pelvic CT showed the stone was no longer present This morning she awoke complaining of severe frequency, urgency with urge incontinence and dysuria with small amounts gross hematuria No fever, chills   PMH: Past Medical History:  Diagnosis Date   Anemia    Arthritis    History of colonic polyps    Hyperlipidemia    Osteoporosis    Rotator cuff tear    Vertigo, benign positional, unspecified laterality     Surgical History: Past Surgical History:  Procedure Laterality Date   ABDOMINAL HYSTERECTOMY     BCC REMOVAL     CARPAL TUNNEL RELEASE Right    COLONOSCOPY     COLONOSCOPY WITH PROPOFOL N/A 06/07/2019   Procedure: COLONOSCOPY WITH PROPOFOL;  Surgeon: Toledo, Benay Pike, MD;  Location: [facility] ENDOSCOPY;  Service: Gastroenterology;  Laterality: N/A;   ROTATOR CUFF SURGERY     THUMB TENDON RELEASE Right    TONSILLECTOMY      Home Medications:  Allergies as of 04/17/2022       Reactions   Codeine Other (See Comments)   Statins Other (See Comments)   MUSCLE PAIN        Medication List        Accurate as of April 17, 2022  1:31 PM. If you have any questions, ask your nurse or doctor.          Cranberry 500 MG Tabs Take 500 mg by mouth daily.   donepezil 5 MG tablet Commonly known as: ARICEPT Take by mouth.   ibuprofen 200 MG tablet Commonly known as: ADVIL Take 200 mg by mouth every 6 (six) hours as needed.   multivitamin tablet Take 1  tablet by mouth daily.   naproxen 500 MG tablet Commonly known as: NAPROSYN Take 500 mg by mouth 2 (two) times daily with a meal.   Omega-3 1000 MG Caps Take 2,000 mg by mouth daily.   oxyCODONE-acetaminophen 5-325 MG tablet Commonly known as: PERCOCET/ROXICET   senna-docusate 8.6-50 MG tablet Commonly known as: Senokot-S Take 1 tablet by mouth daily.   sulfamethoxazole-trimethoprim 800-160 MG tablet Commonly known as: BACTRIM DS Take 1 tablet by mouth every 12 (twelve) hours.   Uribel 118 MG Caps Take 1 capsule (118 mg total) by mouth as needed.        Allergies:  Allergies  Allergen Reactions   Codeine Other (See Comments)   Statins Other (See Comments)    MUSCLE PAIN     Family History: Family History  Problem Relation Age of Onset   Breast cancer Maternal Aunt     Social History:  reports that she has never smoked. She has never used smokeless tobacco. She reports that she does not drink alcohol and does not use drugs.   Physical Exam: There were no vitals taken for this visit.  Constitutional:  Alert and oriented, No acute distress. HEENT: [place] AT Respiratory: Normal respiratory effort, no increased work  of breathing. Psychiatric: Normal mood and affect.  Laboratory Data:  Urinalysis Dipstick 3+ blood/3+ leukocytes/2+ protein Microscopy >30 WBC, >30 RBC, moderate bacteria    Assessment & Plan:    1.  Acute cystitis Urine culture ordered Rx Septra DS twice daily x7 days pending culture results Rx Uribel sent to pharmacy  2.  Gross hematuria Secondary to above   Abbie Sons, MD  [facility] [address], [place], [place] [address] [phone]

## 2022-04-21 ENCOUNTER — Encounter: Payer: Self-pay | Admitting: Urology

## 2022-04-21 LAB — CULTURE, URINE COMPREHENSIVE

## 2022-07-31 ENCOUNTER — Ambulatory Visit: Payer: Medicare Other | Admitting: Urology

## 2022-08-06 ENCOUNTER — Encounter: Payer: Self-pay | Admitting: Urology

## 2022-08-06 ENCOUNTER — Ambulatory Visit (INDEPENDENT_AMBULATORY_CARE_PROVIDER_SITE_OTHER): Payer: Medicare Other | Admitting: Urology

## 2022-08-06 VITALS — BP 125/71 | HR 69 | Ht 64.0 in | Wt 114.0 lb

## 2022-08-06 DIAGNOSIS — R310 Gross hematuria: Secondary | ICD-10-CM

## 2022-08-06 DIAGNOSIS — R350 Frequency of micturition: Secondary | ICD-10-CM | POA: Diagnosis not present

## 2022-08-06 DIAGNOSIS — R3915 Urgency of urination: Secondary | ICD-10-CM

## 2022-08-06 DIAGNOSIS — N3941 Urge incontinence: Secondary | ICD-10-CM | POA: Diagnosis not present

## 2022-08-06 DIAGNOSIS — Z87440 Personal history of urinary (tract) infections: Secondary | ICD-10-CM

## 2022-08-06 DIAGNOSIS — N390 Urinary tract infection, site not specified: Secondary | ICD-10-CM

## 2022-08-06 LAB — URINALYSIS, COMPLETE
Bilirubin, UA: NEGATIVE
Glucose, UA: NEGATIVE
Ketones, UA: NEGATIVE
Leukocytes,UA: NEGATIVE
Nitrite, UA: NEGATIVE
Protein,UA: NEGATIVE
RBC, UA: NEGATIVE
Specific Gravity, UA: 1.02 (ref 1.005–1.030)
Urobilinogen, Ur: 0.2 mg/dL (ref 0.2–1.0)
pH, UA: 6 (ref 5.0–7.5)

## 2022-08-06 LAB — MICROSCOPIC EXAMINATION

## 2022-08-06 MED ORDER — TRIMETHOPRIM 100 MG PO TABS: 100.0000 mg | ORAL_TABLET | Freq: Every day | ORAL | 2 refills | Status: DC

## 2022-08-06 NOTE — Progress Notes (Signed)
08/06/2022 9:10 PM   Carmen Vazquez 02/16/1946 [id number]  Referring provider: Sofie Hartigan, MD [place],  [place] [address]  Chief Complaint  Patient presents with   Recurrent UTI    HPI: 76 y.o. female presents for follow-up of recurrent UTI  Initially seen 1/91/4782 for renal colic secondary to a 3 mm right UVJ calculus At the time of that visit she was asymptomatic but not aware of passing the stone.  No calculus was visualized on KUB and a follow-up pelvic CT showed the stone was no longer present Acute visit 04/17/2022 complaining of frequency, urgency and dysuria with gross hematuria UA at that visit showed significant WBCs/RBCs and she was started empirically on Septra DS and urine culture grew E. coli.  Symptom resolution with antibiotic Recurrent E. coli UTI 06/21/2022 treated at [facility] urgent care [facility] Recently completed a course of Ceftin from a visit in the person, ED for UTI Also has baseline OAB symptoms of frequency, urgency and urge incontinence Also had gross hematuria at her visit 06/21/2022   PMH: Past Medical History:  Diagnosis Date   Anemia    Arthritis    History of colonic polyps    Hyperlipidemia    Osteoporosis    Rotator cuff tear    Vertigo, benign positional, unspecified laterality     Surgical History: Past Surgical History:  Procedure Laterality Date   ABDOMINAL HYSTERECTOMY     BCC REMOVAL     CARPAL TUNNEL RELEASE Right    COLONOSCOPY     COLONOSCOPY WITH PROPOFOL N/A 06/07/2019   Procedure: COLONOSCOPY WITH PROPOFOL;  Surgeon: Toledo, Benay Pike, MD;  Location: [facility] ENDOSCOPY;  Service: Gastroenterology;  Laterality: N/A;   ROTATOR CUFF SURGERY     THUMB TENDON RELEASE Right    TONSILLECTOMY      Home Medications:  Allergies as of 08/06/2022       Reactions   Codeine Other (See Comments)   Statins Other (See Comments)   MUSCLE PAIN        Medication List        Accurate as of August 06, 2022   9:10 PM. If you have any questions, ask your nurse or doctor.          STOP taking these medications    ibuprofen 200 MG tablet Commonly known as: ADVIL Stopped by: Abbie Sons, MD   oxyCODONE-acetaminophen 5-325 MG tablet Commonly known as: PERCOCET/ROXICET Stopped by: Abbie Sons, MD   senna-docusate 8.6-50 MG tablet Commonly known as: Senokot-S Stopped by: Abbie Sons, MD   sulfamethoxazole-trimethoprim 800-160 MG tablet Commonly known as: BACTRIM DS Stopped by: Abbie Sons, MD   Uribel 118 MG Caps Stopped by: Abbie Sons, MD       TAKE these medications    aspirin EC 81 MG tablet Take by mouth.   cephALEXin 500 MG capsule Commonly known as: KEFLEX TAKE 1 CAPSULE BY MOUTH EVERY 12 HOURS FOR 7 DAYS   Cranberry 500 MG Tabs Take 500 mg by mouth daily.   donepezil 5 MG tablet Commonly known as: ARICEPT Take by mouth.   multivitamin tablet Take 1 tablet by mouth daily.   naproxen 500 MG tablet Commonly known as: NAPROSYN Take 500 mg by mouth 2 (two) times daily with a meal.   Omega-3 1000 MG Caps Take 2,000 mg by mouth daily.   omeprazole 40 MG capsule Commonly known as: PRILOSEC TAKE 1 CAPSULE BY MOUTH TWICE DAILY  BEFORE MEAL(S) FOR 14 DAYS        Allergies:  Allergies  Allergen Reactions   Codeine Other (See Comments)   Statins Other (See Comments)    MUSCLE PAIN     Family History: Family History  Problem Relation Age of Onset   Breast cancer Maternal Aunt     Social History:  reports that she has never smoked. She has never used smokeless tobacco. She reports that she does not drink alcohol and does not use drugs.   Physical Exam: BP 125/71   Pulse 69   Ht '5\' 4"'$  (1.626 m)   Wt 114 lb (51.7 kg)   BMI 19.57 kg/m   Constitutional:  Alert and oriented, No acute distress. HEENT: [place] AT Respiratory: Normal respiratory effort, no increased work of breathing. Psychiatric: Normal mood and affect.  Laboratory  Data:  Urinalysis Dipstick/microscopy negative   Assessment & Plan:    1.  Recurrent UTI Recommend starting low-dose vaginal estrogen.  She states she was prescribed a tube of vaginal cream however did not start due to concerns of possible side effects.  They will call back with the name of this cream UA today clear and will start low-dose antibiotic prophylaxis with trimethoprim 100 mg daily  2.  Gross hematuria Most likely secondary to UTI however with recurrent hematuria cystoscopy was scheduled for lower tract evaluation   Abbie Sons, MD  [place] [address] [place], [place] [address] [phone]

## 2022-08-28 ENCOUNTER — Other Ambulatory Visit: Payer: Medicare Other | Admitting: Urology

## 2022-09-10 ENCOUNTER — Encounter: Payer: Self-pay | Admitting: Urology

## 2022-09-10 ENCOUNTER — Ambulatory Visit (INDEPENDENT_AMBULATORY_CARE_PROVIDER_SITE_OTHER): Payer: Medicare Other | Admitting: Urology

## 2022-09-10 VITALS — BP 110/67 | HR 81 | Ht 64.0 in | Wt 117.0 lb

## 2022-09-10 DIAGNOSIS — R310 Gross hematuria: Secondary | ICD-10-CM | POA: Diagnosis not present

## 2022-09-10 DIAGNOSIS — N390 Urinary tract infection, site not specified: Secondary | ICD-10-CM

## 2022-09-10 DIAGNOSIS — N3001 Acute cystitis with hematuria: Secondary | ICD-10-CM

## 2022-09-10 DIAGNOSIS — Z87440 Personal history of urinary (tract) infections: Secondary | ICD-10-CM

## 2022-09-10 LAB — URINALYSIS, COMPLETE
Bilirubin, UA: NEGATIVE
Glucose, UA: NEGATIVE
Ketones, UA: NEGATIVE
Leukocytes,UA: NEGATIVE
Nitrite, UA: NEGATIVE
Protein,UA: NEGATIVE
Specific Gravity, UA: 1.025 (ref 1.005–1.030)
Urobilinogen, Ur: 1 mg/dL (ref 0.2–1.0)
pH, UA: 6 (ref 5.0–7.5)

## 2022-09-10 LAB — MICROSCOPIC EXAMINATION

## 2022-09-10 NOTE — Patient Instructions (Addendum)
Continue taking the Trimethoprim for prevention of UTI. Start the [place]or samples daily for bladder control. If the [facility] works call our office and let us know to send in a RX to the pharmacy.

## 2022-09-11 ENCOUNTER — Encounter: Payer: Self-pay | Admitting: Urology

## 2022-09-11 NOTE — Progress Notes (Signed)
   09/11/22  CC:  Chief Complaint  Patient presents with   Cysto    HPI: 76 y.o. female with recurrent UTI and episode of gross hematuria associated with infection.  Also with OAB symptoms of frequency, urgency and urge incontinence.  Prior CT with a 3 mm distal calculus which passed.  The episode hematuria occurred after stone.  Has not had recurrent infections on low-dose trimethoprim daily.  Had apparently been prescribed a "vaginal cream" though her husband states they were unable to find.  UA today clear  Blood pressure 110/67, pulse 81, height '5\' 4"'$  (1.626 m), weight 117 lb (53.1 kg). NED. A&Ox3.   No respiratory distress   Abd soft, NT, ND Normal external genitalia with patent urethral meatus  Cystoscopy Procedure Note  Patient identification was confirmed, informed consent was obtained, and patient was prepped using Betadine solution.  Lidocaine jelly was administered per urethral meatus.    Procedure: - Flexible cystoscope introduced, without any difficulty.   - Thorough search of the bladder revealed:    normal urethral meatus    normal urothelium    no stones    no ulcers     no tumors    no urethral polyps    no trabeculation  - Ureteral orifices were normal in position and appearance.  Post-Procedure: - Patient tolerated the procedure well  Assessment/ Plan: No mucosal abnormalities on cystoscopy Continue low-dose suppression with trimethoprim x 8 weeks They elected to hold off on low-dose vaginal estrogen at this time Trial Gemtesa 75 mg for her OAB symptoms PA follow-up 3 months with symptom check    Abbie Sons, MD

## 2022-11-10 NOTE — Progress Notes (Unsigned)
11/11/2022 4:56 PM   Carmen Vazquez 07/08/1946 [id number]  Referring provider: Sofie Hartigan, MD [place],  [place] [address]  Urological history: 1.  Recurrent UTI's -Contributing factors of age and vaginal atrophy -Documented urine cultures over the last year  June 21, 2022 E. coli  April 17, 2022 E. Coli -Trimethoprim 100 mg daily  2.  Nephrolithiasis -CT (01/2022) right UVJ stone spontaneously passed  3. Renal cyst -CT (01/2022) - Benign Bosniak 1 renal cyst of the RIGHT kidney   4. High risk hematuria -non-smoker -CT pelvis (02/2022) -no worrisome findings -RUS (02/2022) -mild right sided hydronephrosis -CT (01/2022)  -right UVJ stone -cysto (08/2022) - NED -no reports of gross heme -UA ***  5. OAB -Contributing factors of age, vaginal atrophy, anxiety and LBP -PVR *** -Gemtesa 75 mg daily   No chief complaint on file.   HPI: Carmen Vazquez is a 77 y.o. female who presents today for urinary frequency and pain.    UA ***   PMH: Past Medical History:  Diagnosis Date   Anemia    Arthritis    History of colonic polyps    Hyperlipidemia    Osteoporosis    Rotator cuff tear    Vertigo, benign positional, unspecified laterality     Surgical History: Past Surgical History:  Procedure Laterality Date   ABDOMINAL HYSTERECTOMY     BCC REMOVAL     CARPAL TUNNEL RELEASE Right    COLONOSCOPY     COLONOSCOPY WITH PROPOFOL N/A 06/07/2019   Procedure: COLONOSCOPY WITH PROPOFOL;  Surgeon: Toledo, Benay Pike, MD;  Location: [facility] ENDOSCOPY;  Service: Gastroenterology;  Laterality: N/A;   ROTATOR CUFF SURGERY     THUMB TENDON RELEASE Right    TONSILLECTOMY      Home Medications:  Allergies as of 11/11/2022       Reactions   Codeine Other (See Comments)   Statins Other (See Comments)   MUSCLE PAIN        Medication List        Accurate as of November 10, 2022  4:56 PM. If you have any questions, ask your nurse or doctor.           aspirin EC 81 MG tablet Take by mouth.   cephALEXin 500 MG capsule Commonly known as: KEFLEX TAKE 1 CAPSULE BY MOUTH EVERY 12 HOURS FOR 7 DAYS   Cranberry 500 MG Tabs Take 500 mg by mouth daily.   donepezil 5 MG tablet Commonly known as: ARICEPT Take by mouth.   multivitamin tablet Take 1 tablet by mouth daily.   naproxen 500 MG tablet Commonly known as: NAPROSYN Take 500 mg by mouth 2 (two) times daily with a meal.   Omega-3 1000 MG Caps Take 2,000 mg by mouth daily.   omeprazole 40 MG capsule Commonly known as: PRILOSEC TAKE 1 CAPSULE BY MOUTH TWICE DAILY BEFORE MEAL(S) FOR 14 DAYS   trimethoprim 100 MG tablet Commonly known as: TRIMPEX Take 1 tablet (100 mg total) by mouth daily.        Allergies:  Allergies  Allergen Reactions   Codeine Other (See Comments)   Statins Other (See Comments)    MUSCLE PAIN     Family History: Family History  Problem Relation Age of Onset   Breast cancer Maternal Aunt     Social History:  reports that she has never smoked. She has never used smokeless tobacco. She reports that she does not drink alcohol  and does not use drugs.  ROS: Pertinent ROS in HPI  Physical Exam: There were no vitals taken for this visit.  Constitutional:  Well nourished. Alert and oriented, No acute distress. HEENT: [place] AT, moist mucus membranes.  Trachea midline, no masses. Cardiovascular: No clubbing, cyanosis, or edema. Respiratory: Normal respiratory effort, no increased work of breathing. GU: No CVA tenderness.  No bladder fullness or masses. Vulvovaginal atrophy w/ pallor, loss of rugae, introital retraction, excoriations.  Vulvar thinning, fusion of labia, clitoral hood retraction, prominent urethral meatus.   *** external genitalia, *** pubic hair distribution, no lesions.  Normal urethral meatus, no lesions, no prolapse, no discharge.   No urethral masses, tenderness and/or tenderness. No bladder fullness, tenderness or  masses. *** vagina mucosa, *** estrogen effect, no discharge, no lesions, *** pelvic support, *** cystocele and *** rectocele noted.  No cervical motion tenderness.  Uterus is freely mobile and non-fixed.  No adnexal/parametria masses or tenderness noted.  Anus and perineum are without rashes or lesions.   ***  Neurologic: Grossly intact, no focal deficits, moving all 4 extremities. Psychiatric: Normal mood and affect.    Laboratory Data: Lab Results  Component Value Date   WBC 7.9 02/14/2022   HGB 13.2 02/14/2022   HCT 41.5 02/14/2022   MCV 96.7 02/14/2022   PLT 303 02/14/2022   Serum creatinine (06/2022) 0.6  Lab Results  Component Value Date   AST 33 02/14/2022   Lab Results  Component Value Date   ALT 13 02/14/2022   Urinalysis See epic and HPI I have reviewed the labs.   Pertinent Imaging: N/A  Assessment & Plan:  ***  1.  Suspected UTI ***  No follow-ups on file.  These notes generated with voice recognition software. I apologize for typographical errors.  [place], [place] [address]  [place], [place] [address] [phone]

## 2022-11-11 ENCOUNTER — Encounter: Payer: Self-pay | Admitting: Urology

## 2022-11-11 ENCOUNTER — Ambulatory Visit (INDEPENDENT_AMBULATORY_CARE_PROVIDER_SITE_OTHER): Payer: Medicare Other | Admitting: Urology

## 2022-11-11 VITALS — BP 116/73 | HR 70 | Ht 64.0 in | Wt 120.0 lb

## 2022-11-11 DIAGNOSIS — R3989 Other symptoms and signs involving the genitourinary system: Secondary | ICD-10-CM

## 2022-11-11 DIAGNOSIS — N952 Postmenopausal atrophic vaginitis: Secondary | ICD-10-CM | POA: Diagnosis not present

## 2022-11-11 DIAGNOSIS — R3915 Urgency of urination: Secondary | ICD-10-CM | POA: Diagnosis not present

## 2022-11-11 LAB — URINALYSIS, COMPLETE
Bilirubin, UA: NEGATIVE
Glucose, UA: NEGATIVE
Ketones, UA: NEGATIVE
Leukocytes,UA: NEGATIVE
Nitrite, UA: NEGATIVE
Protein,UA: NEGATIVE
RBC, UA: NEGATIVE
Specific Gravity, UA: 1.02 (ref 1.005–1.030)
Urobilinogen, Ur: 0.2 mg/dL (ref 0.2–1.0)
pH, UA: 6 (ref 5.0–7.5)

## 2022-11-11 LAB — MICROSCOPIC EXAMINATION

## 2022-11-11 LAB — BLADDER SCAN AMB NON-IMAGING: Scan Result: 67

## 2022-11-11 MED ORDER — MIRABEGRON ER 25 MG PO TB24: 25.0000 mg | ORAL_TABLET | Freq: Every day | ORAL | 0 refills | Status: DC

## 2022-11-11 MED ORDER — ESTRADIOL 0.1 MG/GM VA CREA: TOPICAL_CREAM | VAGINAL | 12 refills | Status: DC

## 2022-11-17 LAB — CULTURE, URINE COMPREHENSIVE

## 2022-11-18 ENCOUNTER — Telehealth: Payer: Self-pay | Admitting: Family Medicine

## 2022-11-18 MED ORDER — NITROFURANTOIN MONOHYD MACRO 100 MG PO CAPS: 100.0000 mg | ORAL_CAPSULE | Freq: Two times a day (BID) | ORAL | 0 refills | Status: DC

## 2022-11-18 NOTE — Telephone Encounter (Signed)
Patient notified and voiced understanding. ABX sent to pharmacy.  

## 2022-11-18 NOTE — Telephone Encounter (Signed)
-----   Message from Nori Riis, PA-C sent at 11/17/2022  4:33 PM EST ----- Please let Mrs. Luckey know that her urine culture was positive for infection and I would like for her to start Macrobid 100 mg twice daily for seven days.  She needs to stop the trimethoprim while taking the Macrobid.

## 2022-12-09 NOTE — Progress Notes (Deleted)
12/10/2022 10:35 AM   Carmen Vazquez Jun 21, 1946 [id number]  Referring provider: Sofie Hartigan, MD [place],  [place] [address]  Urological history: 1.  Recurrent UTI's -Contributing factors of age and vaginal atrophy -Documented urine cultures over the last year  June 21, 2022 E. coli  April 17, 2022 E. Coli -Trimethoprim 100 mg daily  2.  Nephrolithiasis -CT (01/2022) right UVJ stone spontaneously passed  3. Renal cyst -CT (01/2022) - Benign Bosniak 1 renal cyst of the RIGHT kidney   4. High risk hematuria -non-smoker -CT pelvis (02/2022) -no worrisome findings -RUS (02/2022) -mild right sided hydronephrosis -CT (01/2022)  -right UVJ stone -cysto (08/2022) - NED -no reports of gross heme -UA negative for micro heme  5. OAB -Contributing factors of age, vaginal atrophy, anxiety and LBP -PVR 67 mL -Gemtesa 75 mg daily -cost prohibitive   No chief complaint on file.   HPI: Carmen Vazquez is a 77 y.o. female who presents today for 3-week follow-up after trial Myrbetriq 25 mg daily for urgency.  PVR ***    PMH: Past Medical History:  Diagnosis Date   Anemia    Arthritis    History of colonic polyps    Hyperlipidemia    Osteoporosis    Rotator cuff tear    Vertigo, benign positional, unspecified laterality     Surgical History: Past Surgical History:  Procedure Laterality Date   ABDOMINAL HYSTERECTOMY     BCC REMOVAL     CARPAL TUNNEL RELEASE Right    COLONOSCOPY     COLONOSCOPY WITH PROPOFOL N/A 06/07/2019   Procedure: COLONOSCOPY WITH PROPOFOL;  Surgeon: Toledo, Benay Pike, MD;  Location: [facility] ENDOSCOPY;  Service: Gastroenterology;  Laterality: N/A;   ROTATOR CUFF SURGERY     THUMB TENDON RELEASE Right    TONSILLECTOMY      Home Medications:  Allergies as of 12/10/2022       Reactions   Codeine Other (See Comments)   Statins Other (See Comments)   MUSCLE PAIN        Medication List        Accurate as of  December 09, 2022 10:35 AM. If you have any questions, ask your nurse or doctor.          aspirin EC 81 MG tablet Take by mouth.   Cranberry 500 MG Tabs Take 500 mg by mouth daily.   donepezil 5 MG tablet Commonly known as: ARICEPT Take by mouth.   estradiol 0.1 MG/GM vaginal cream Commonly known as: ESTRACE VAGINAL Apply 0.'5mg'$  (pea-sized amount)  just inside the vaginal introitus with a finger-tip on Monday, Wednesday and Friday nights.   mirabegron ER 25 MG Tb24 tablet Commonly known as: MYRBETRIQ Take 1 tablet (25 mg total) by mouth daily.   multivitamin tablet Take 1 tablet by mouth daily.   naproxen 500 MG tablet Commonly known as: NAPROSYN Take 500 mg by mouth 2 (two) times daily with a meal.   nitrofurantoin (macrocrystal-monohydrate) 100 MG capsule Commonly known as: MACROBID Take 1 capsule (100 mg total) by mouth every 12 (twelve) hours.   Omega-3 1000 MG Caps Take 2,000 mg by mouth daily.   omeprazole 40 MG capsule Commonly known as: PRILOSEC TAKE 1 CAPSULE BY MOUTH TWICE DAILY BEFORE MEAL(S) FOR 14 DAYS   trimethoprim 100 MG tablet Commonly known as: TRIMPEX Take 1 tablet (100 mg total) by mouth daily.        Allergies:  Allergies  Allergen Reactions  Codeine Other (See Comments)   Statins Other (See Comments)    MUSCLE PAIN     Family History: Family History  Problem Relation Age of Onset   Breast cancer Maternal Aunt     Social History:  reports that she has never smoked. She has never used smokeless tobacco. She reports that she does not drink alcohol and does not use drugs.  ROS: Pertinent ROS in HPI  Physical Exam: There were no vitals taken for this visit.  Constitutional:  Well nourished. Alert and oriented, No acute distress. HEENT: [place] AT, moist mucus membranes.  Trachea midline, no masses. Cardiovascular: No clubbing, cyanosis, or edema. Respiratory: Normal respiratory effort, no increased work of breathing. GU: No CVA  tenderness.  No bladder fullness or masses. Vulvovaginal atrophy w/ pallor, loss of rugae, introital retraction, excoriations.  Vulvar thinning, fusion of labia, clitoral hood retraction, prominent urethral meatus.   *** external genitalia, *** pubic hair distribution, no lesions.  Normal urethral meatus, no lesions, no prolapse, no discharge.   No urethral masses, tenderness and/or tenderness. No bladder fullness, tenderness or masses. *** vagina mucosa, *** estrogen effect, no discharge, no lesions, *** pelvic support, *** cystocele and *** rectocele noted.  No cervical motion tenderness.  Uterus is freely mobile and non-fixed.  No adnexal/parametria masses or tenderness noted.  Anus and perineum are without rashes or lesions.   ***  Neurologic: Grossly intact, no focal deficits, moving all 4 extremities. Psychiatric: Normal mood and affect.    Laboratory Data: N/A  Pertinent Imaging: ***   Assessment & Plan:    1.  Urgency - UA benign -Urine culture sent to rule out indolent infection -Felt that [facility] worked, but unfortunately is not likely covered by her insurance, but Myrbetriq 25 mg appears to be covered so I have given her samples of this medication -RTC in 3 weeks for OAB and PVR  2.  Vaginal atrophy -Prescribed vaginal Estrace cream to apply Monday, Wednesday and Friday and given good Rx coupon  No follow-ups on file.  These notes generated with voice recognition software. I apologize for typographical errors.  [place], [place] [address]  [place], [place] [address] [phone]

## 2022-12-10 ENCOUNTER — Ambulatory Visit: Payer: Medicare Other | Admitting: Urology

## 2022-12-10 DIAGNOSIS — R3915 Urgency of urination: Secondary | ICD-10-CM

## 2022-12-10 DIAGNOSIS — N952 Postmenopausal atrophic vaginitis: Secondary | ICD-10-CM

## 2023-04-07 IMAGING — CR DG ABDOMEN 1V
1 series · 1 of 1 positions shown · non-contrast
Comparison: CT of the abdomen pelvis 02/14/2022

CLINICAL DATA: Right ureteral calculus.

EXAM:
ABDOMEN - 1 VIEW

[dg abd 1 view]
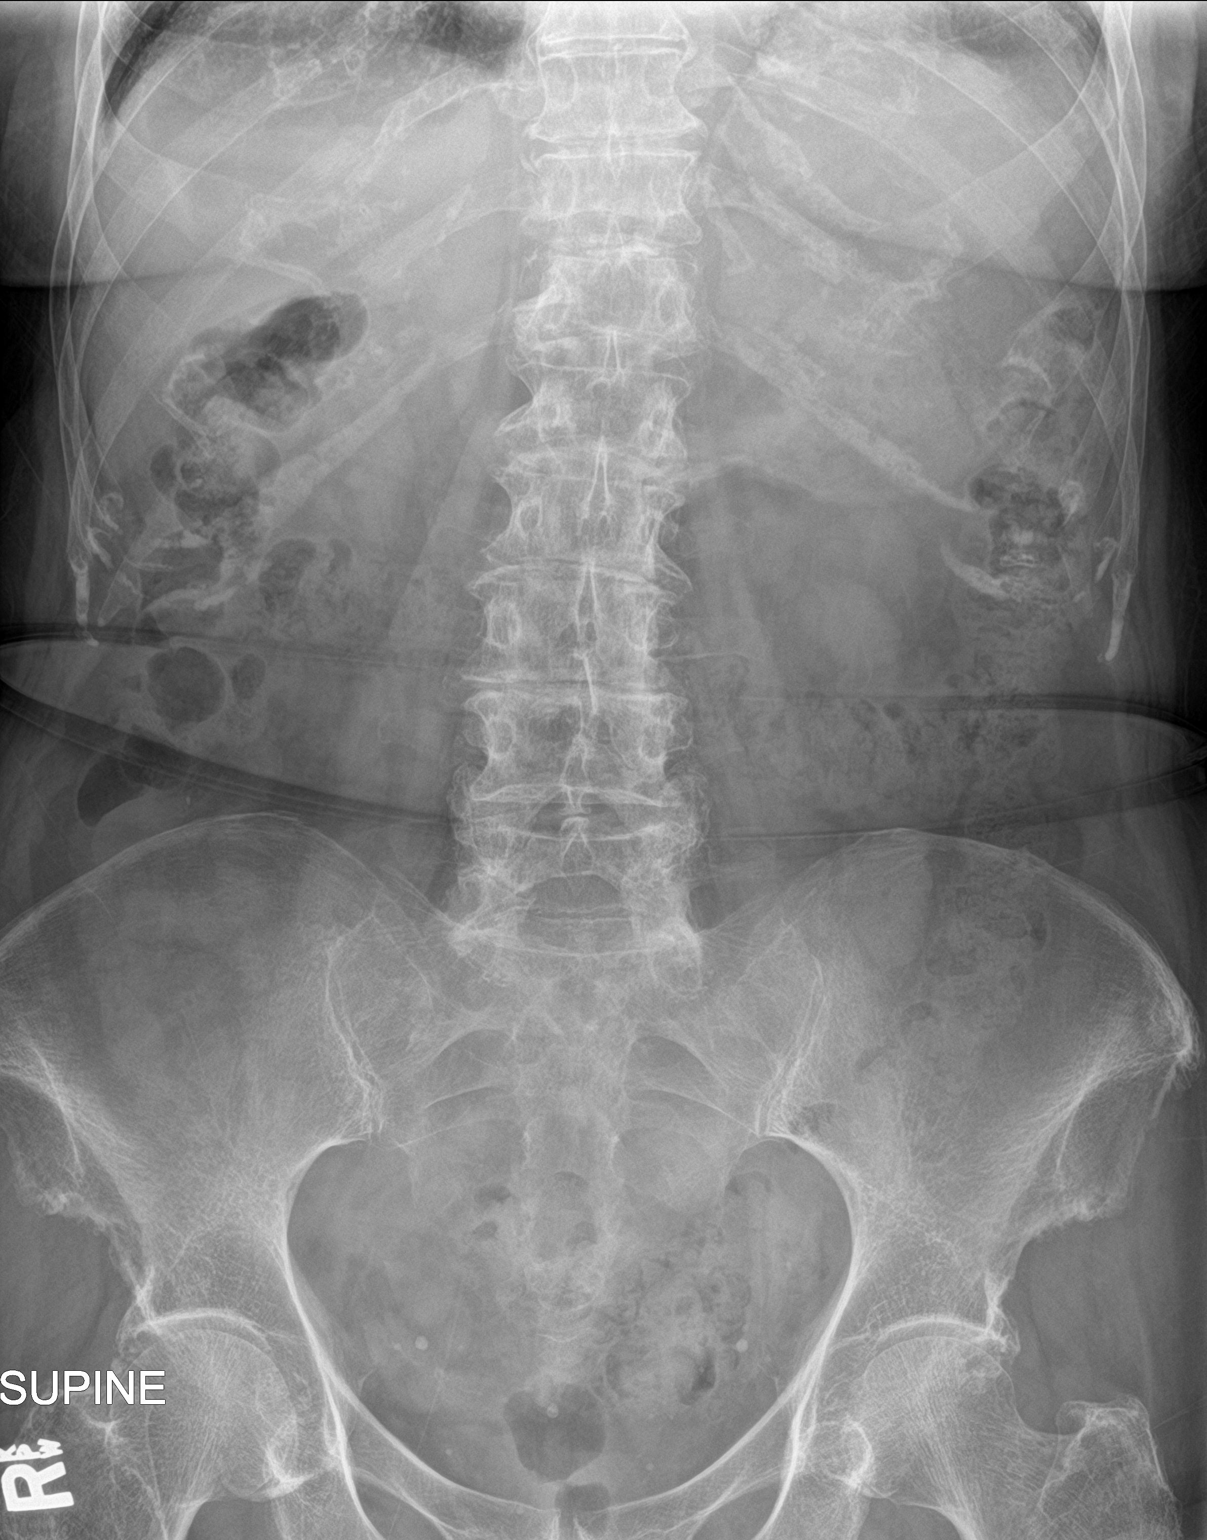

[1 of 1 positions shown; findings below may reference images not displayed]

FINDINGS: No stones project over the renal shadows. Pelvic phleboliths are
present without definite distal ureteral stone. Bowel gas pattern is
unremarkable. Multilevel degenerative changes are present in the
lumbar spine.
IMPRESSION: 1. No definite renal or distal ureteral stone.
2. Pelvic phleboliths.

## 2023-04-20 ENCOUNTER — Ambulatory Visit (INDEPENDENT_AMBULATORY_CARE_PROVIDER_SITE_OTHER): Payer: Medicare Other | Admitting: Physician Assistant

## 2023-04-20 VITALS — BP 116/72 | HR 74 | Ht 64.0 in | Wt 120.0 lb

## 2023-04-20 DIAGNOSIS — Z87442 Personal history of urinary calculi: Secondary | ICD-10-CM

## 2023-04-20 DIAGNOSIS — Z87440 Personal history of urinary (tract) infections: Secondary | ICD-10-CM

## 2023-04-20 DIAGNOSIS — Z09 Encounter for follow-up examination after completed treatment for conditions other than malignant neoplasm: Secondary | ICD-10-CM | POA: Diagnosis not present

## 2023-04-20 DIAGNOSIS — N390 Urinary tract infection, site not specified: Secondary | ICD-10-CM

## 2023-04-20 MED ORDER — ESTRADIOL 0.1 MG/GM VA CREA: TOPICAL_CREAM | VAGINAL | 5 refills | Status: DC

## 2023-04-20 NOTE — Progress Notes (Unsigned)
04/20/2023 10:51 AM   [name] 1946-08-29 [id number]  CC: Chief Complaint  Patient presents with   Follow-up   HPI: Carmen Vazquez is a 77 y.o. female with PMH recurrent UTI on suppressive trimethoprim, estrogen cream, and cranberry; nephrolithiasis; and OAB on Myrbetriq 25 mg who presents today for evaluation of recurrent versus persistent UTI.  She is accompanied today by her husband, who contributes to HPI.  Today she reports 3 seemingly back-to-back UTIs since she was last seen in clinic 5 months ago.  Her most recent infection occurred late June/early July and she sought care at another facility because she was on vacation at the time.  Today she reports her UTI symptoms have resolved and she has no acute concerns today.  She started using topical vaginal estrogen cream more consistently, and they have noticed a big difference in her UTI frequency with this.  She still has some occasional twinges of right-sided abdominal discomfort and they wonder if this could represent a kidney stone.  She denies gross hematuria.  In-office UA today positive for trace glucose, 1+ ketones, and trace protein; urine microscopy pan negative.   PMH: Past Medical History:  Diagnosis Date   Anemia    Arthritis    History of colonic polyps    Hyperlipidemia    Osteoporosis    Rotator cuff tear    Vertigo, benign positional, unspecified laterality     Surgical History: Past Surgical History:  Procedure Laterality Date   ABDOMINAL HYSTERECTOMY     BCC REMOVAL     CARPAL TUNNEL RELEASE Right    COLONOSCOPY     COLONOSCOPY WITH PROPOFOL N/A 06/07/2019   Procedure: COLONOSCOPY WITH PROPOFOL;  Surgeon: Toledo, Boykin Nearing, MD;  Location: [facility] END[facility]COPY;  Service: Gastroenterology;  Laterality: N/A;   ROTATOR CUFF SURGERY     THUMB TENDON RELEASE Right    TONSILLECTOMY      Home Medications:  Allergies as of 04/20/2023       Reactions   Codeine Other (See Comments)   Statins  Other (See Comments)   MUSCLE PAIN        Medication List        Accurate as of April 20, 2023 10:51 AM. If you have any questions, ask your nurse or doctor.          aspirin EC 81 MG tablet Take by mouth.   Cranberry 500 MG Tabs Take 500 mg by mouth daily.   donepezil 5 MG tablet Commonly known as: ARICEPT Take by mouth.   estradiol 0.1 MG/GM vaginal cream Commonly known as: ESTRACE VAGINAL Apply 0.5mg  (pea-sized amount)  just inside the vaginal introitus with a finger-tip on Monday, Wednesday and Friday nights.   mirabegron ER 25 MG Tb24 tablet Commonly known as: MYRBETRIQ Take 1 tablet (25 mg total) by mouth daily.   multivitamin tablet Take 1 tablet by mouth daily.   naproxen 500 MG tablet Commonly known as: NAPROSYN Take 500 mg by mouth 2 (two) times daily with a meal.   nitrofurantoin (macrocrystal-monohydrate) 100 MG capsule Commonly known as: MACROBID Take 1 capsule (100 mg total) by mouth every 12 (twelve) hours.   Omega-3 1000 MG Caps Take 2,000 mg by mouth daily.   omeprazole 40 MG capsule Commonly known as: PRILOSEC TAKE 1 CAPSULE BY MOUTH TWICE DAILY BEFORE MEAL(S) FOR 14 DAYS   trimethoprim 100 MG tablet Commonly known as: TRIMPEX Take 1 tablet (100 mg total) by mouth daily.  Allergies:  Allergies  Allergen Reactions   Codeine Other (See Comments)   Statins Other (See Comments)    MUSCLE PAIN     Family History: Family History  Problem Relation Age of Onset   Breast cancer Maternal Aunt     Social History:   reports that she has never smoked. She has never used smokeless tobacco. She reports that she does not drink alcohol and does not use drugs.  Physical Exam: BP 116/72   Pulse 74   Ht 5\' 4"  (1.626 m)   Wt 120 lb (54.4 kg)   BMI 20.60 kg/m   Constitutional:  Alert and oriented, no acute distress, nontoxic appearing HEENT: [place], AT Cardiovascular: No clubbing, cyanosis, or edema Respiratory: Normal respiratory  effort, no increased work of breathing Skin: No rashes, bruises or suspicious lesions Neurologic: Grossly intact, no focal deficits, moving all 4 extremities Psychiatric: Normal mood and affect  Laboratory Data: Results for orders placed or performed in visit on 04/20/23  Microscopic Examination   Urine  Result Value Ref Range   WBC, UA 0-5 0 - 5 /hpf   RBC, Urine 0-2 0 - 2 /hpf   Epithelial Cells (non renal) 0-10 0 - 10 /hpf   Casts Present (A) None seen /lpf   Cast Type Hyaline casts N/A   Mucus, UA Present (A) Not Estab.   Bacteria, UA None seen None seen/Few  Urinalysis, Complete  Result Value Ref Range   Specific Gravity, UA 1.020 1.005 - 1.030   pH, UA 6.0 5.0 - 7.5   Color, UA Orange Yellow   Appearance Ur Cloudy (A) Clear   Leukocytes,UA Negative Negative   Protein,UA Trace Negative/Trace   Glucose, UA Trace (A) Negative   Ketones, UA 1+ (A) Negative   RBC, UA Negative Negative   Bilirubin, UA Negative Negative   Urobilinogen, Ur 0.2 0.2 - 1.0 mg/dL   Nitrite, UA Negative Negative   Microscopic Examination See below:    Assessment & Plan:   1. Recurrent UTI UA is bland today and she is asymptomatic.  They have noticed a decrease in infections with the use of topical vaginal estrogen cream.  I encouraged them to continue this indefinitely.  Refill sent. - Urinalysis, Complete - estradiol (ESTRACE) 0.1 MG/GM vaginal cream; Apply one pea-sized amount around the opening of the urethra every Monday, Wednesday, and Friday.  Dispense: 42.5 g; Refill: 5  2. History of nephrolithiasis Occasional twinges of right-sided abdominal pain without gross hematuria.  I offered them a renal ultrasound, but they would like to defer this, which is reasonable.  Will consider imaging if her symptoms become worse in the future.  Return if symptoms worsen or fail to improve.  Carman Ching, PA-C  [facility] [place] [address], [address] [place], [place]  [address] [phone]

## 2023-04-21 LAB — MICROSCOPIC EXAMINATION: Bacteria, UA: NONE SEEN

## 2023-04-21 LAB — URINALYSIS, COMPLETE
Bilirubin, UA: NEGATIVE
Leukocytes,UA: NEGATIVE
Nitrite, UA: NEGATIVE
RBC, UA: NEGATIVE
Specific Gravity, UA: 1.02 (ref 1.005–1.030)
Urobilinogen, Ur: 0.2 mg/dL (ref 0.2–1.0)
pH, UA: 6 (ref 5.0–7.5)

## 2023-04-25 IMAGING — US US RENAL
1 series · 14 of 25 positions shown · non-contrast
Comparison: CT abdomen dated 02/14/2022.

CLINICAL DATA: Hydronephrosis with urinary obstruction.

EXAM:
RENAL / URINARY TRACT ULTRASOUND COMPLETE

[Series 1: us renal · 14 of 86 slices shown]
[im 1/86]
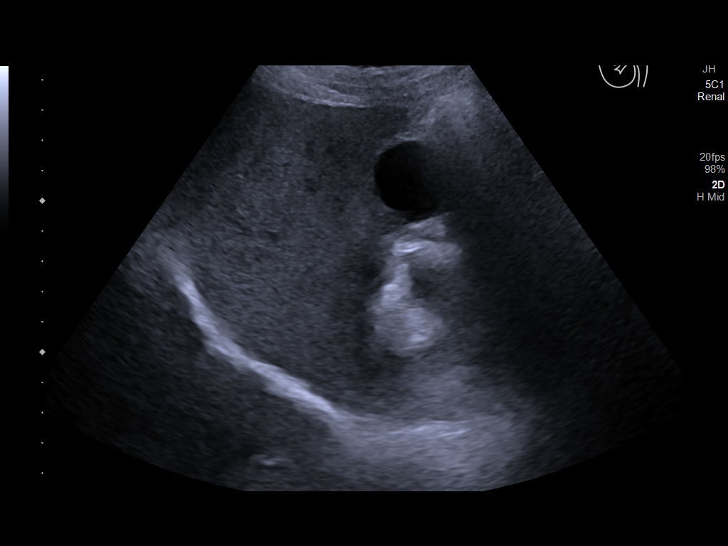
[im 8/86]
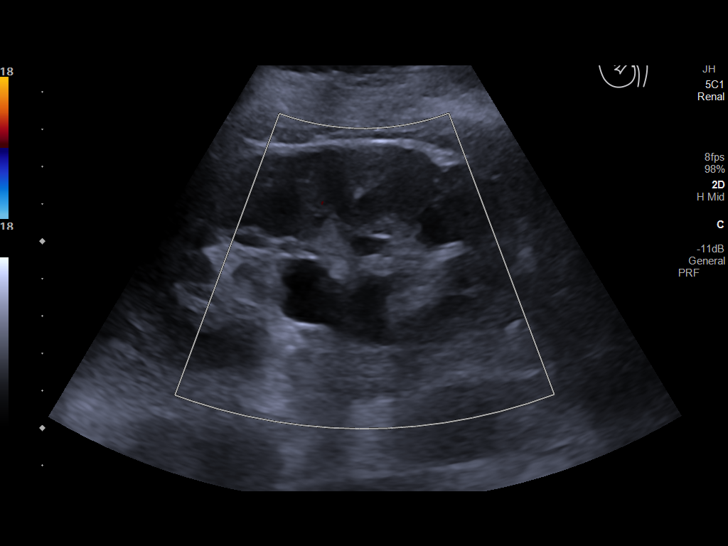
[im 15/86]
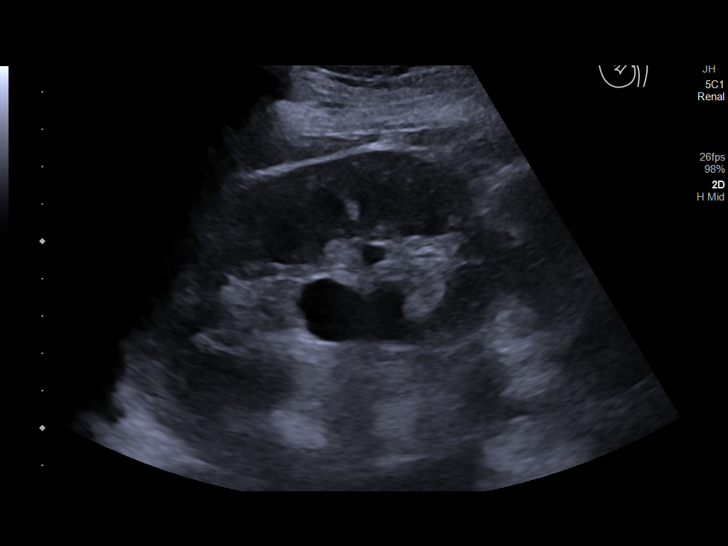
[im 22/86]
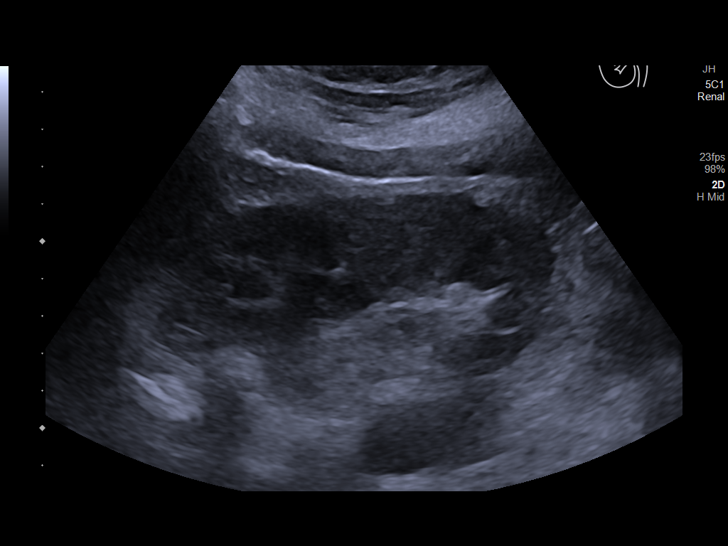
[im 29/86]
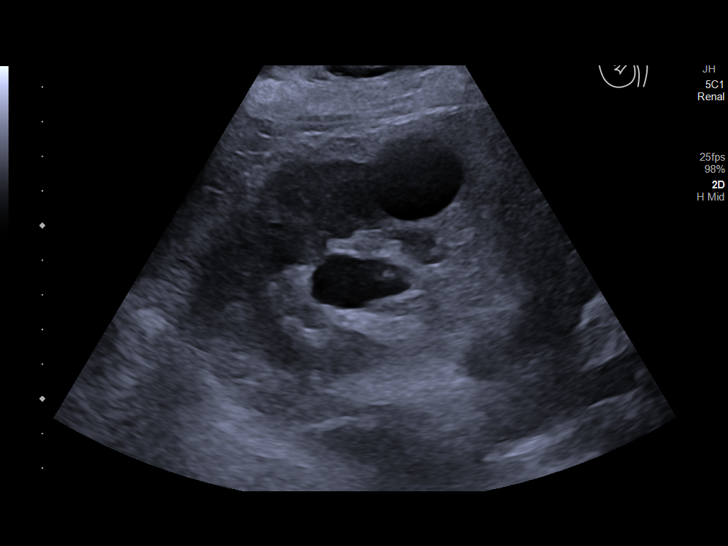
[im 32/86]
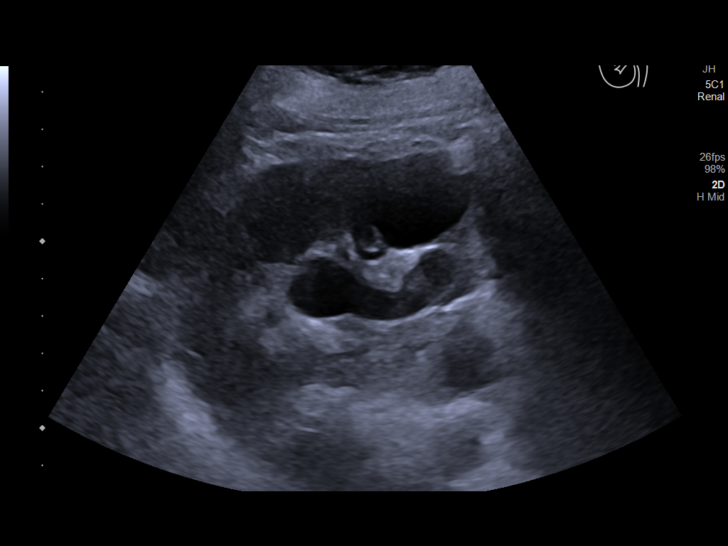
[im 39/86]
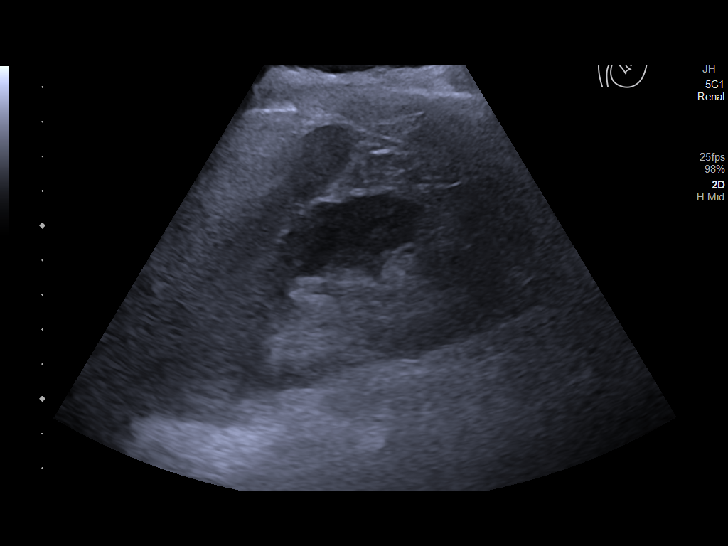
[im 47/86]
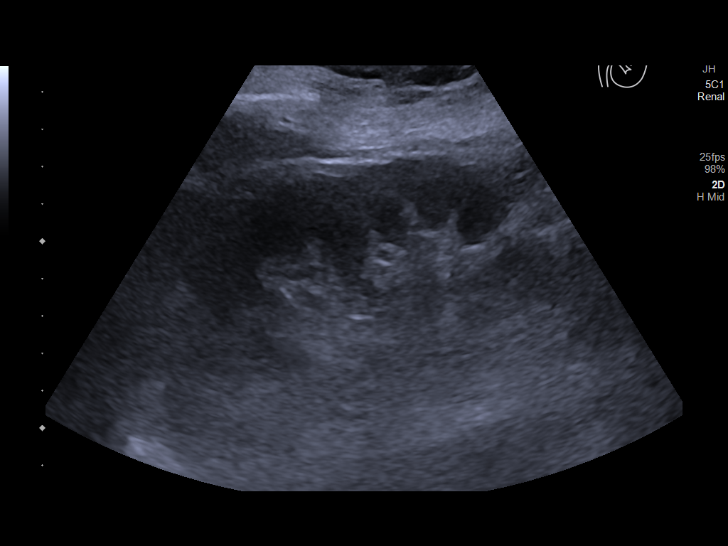
[im 54/86]
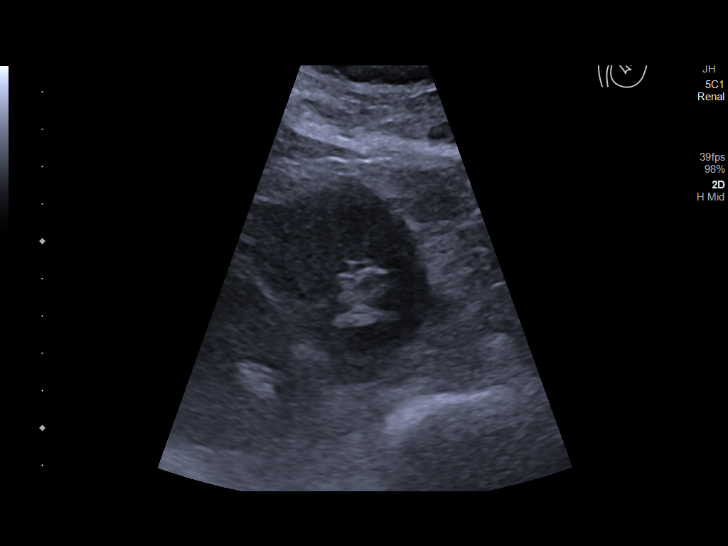
[im 57/86]
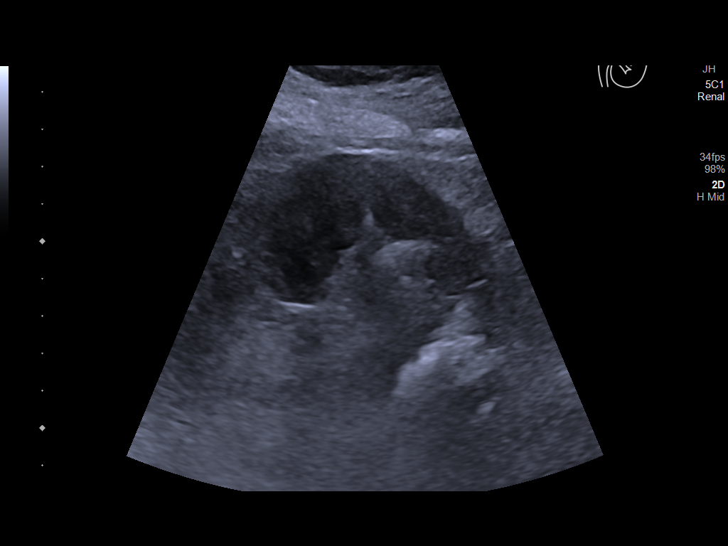
[im 64/86]
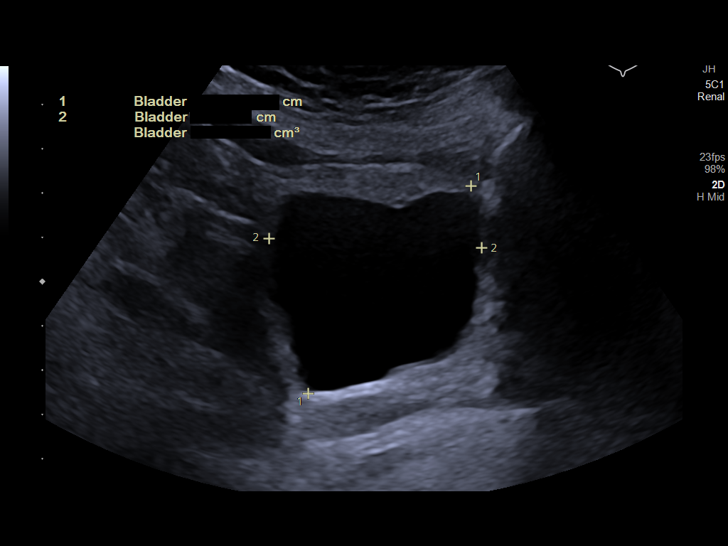
[im 71/86]
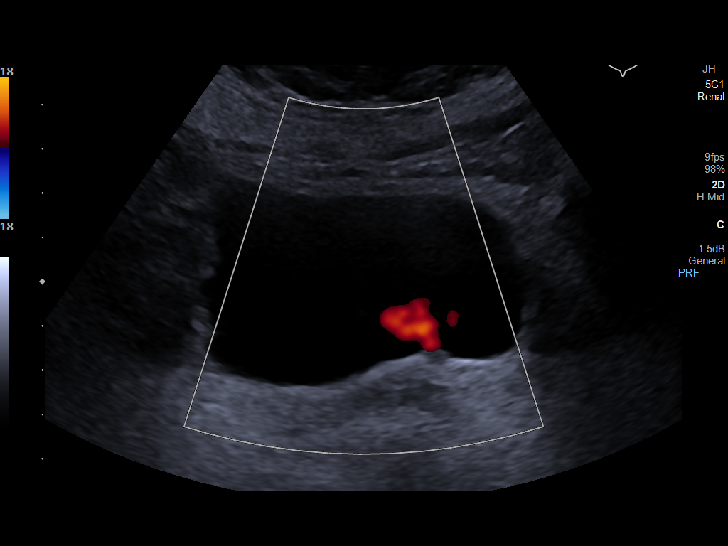
[im 78/86]
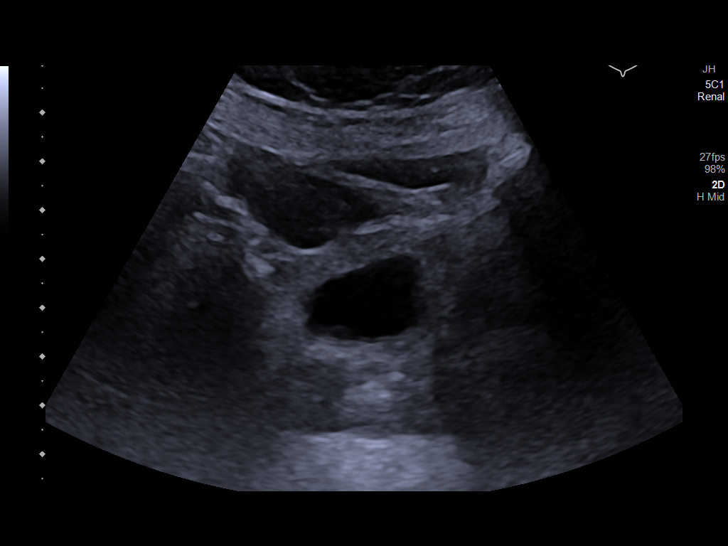
[im 86/86]
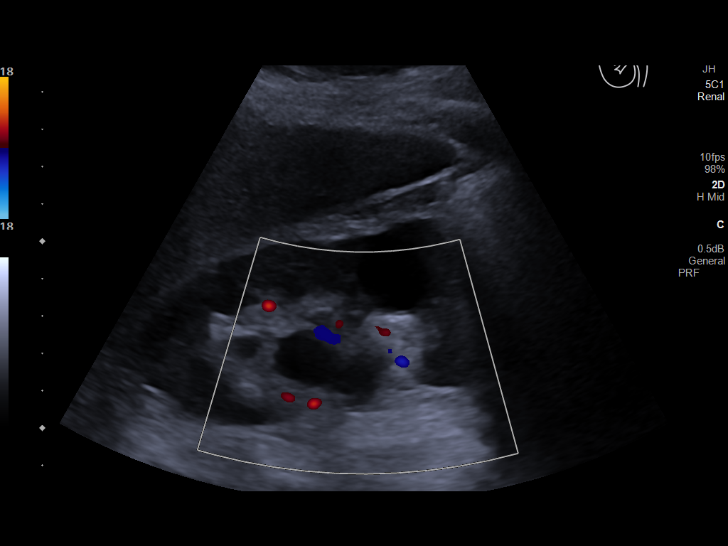

[14 of 25 positions shown; findings below may reference images not displayed]

FINDINGS: Right Kidney:

Renal measurements: 10.7 x 6.4 x 5.7 cm = volume: 204 mL. Cortical
echogenicity within normal limits. 2 benign cysts, largest measuring
4 cm. No suspicious mass. Mild hydronephrosis.

Left Kidney:

Renal measurements: 10.9 x 5.5 x 5.4 cm = volume: 172 mL.
Echogenicity within normal limits. No mass or hydronephrosis
visualized.

Bladder:

Appears normal for degree of bladder distention. Prevoid volume
measures 113 cc. No significant postvoid residual (approximate 12
cc).

Other:

Both distal ureters are shown to be patent at the level of the
bladder (bilateral ureteral jets visualized).
IMPRESSION: 1. Mild RIGHT-sided hydronephrosis, not significantly changed
compared to the degree of hydronephrosis demonstrated on CT abdomen
of 02/14/2022.
2. Both distal ureters are shown to be patent at the level of the
bladder (bilateral ureteral jets visualized).
3. Minimal postvoid residual in the bladder.

## 2023-08-10 NOTE — Progress Notes (Unsigned)
08/11/2023 8:37 AM   [name] [birth year] [id number]  Referring provider: Marina Goodell, MD [address] [place],  [place] [address]  Urological history: 1.  Recurrent UTI's -Contributing factors of age and vaginal atrophy -Documented urine cultures over the last year  November 11, 2022-E. coli -Trimethoprim 100 mg daily, vaginal estrogen cream and cranberry  2.  Nephrolithiasis -CT (01/2022) right UVJ stone spontaneously passed  3. Renal cyst -CT (01/2022) - Benign Bosniak 1 renal cyst of the RIGHT kidney   4. High risk hematuria -non-smoker -CT pelvis (02/2022) -no worrisome findings -RUS (02/2022) -mild right sided hydronephrosis -CT (01/2022)  -right UVJ stone -cysto (08/2022) - NED  5. OAB -Contributing factors of age, vaginal atrophy, anxiety and LBP -Myrbetriq 25 mg daily  Chief Complaint  Patient presents with   Nocturia   HPI: Carmen Vazquez is a 77 y.o. female who presents today for urinary frequency and pain with urination with her husband, Carmen Vazquez.   Previous records reviewed.    History is obtained from both husband and patient.  They are both difficult historians as they are not completely sure what medications she is taking and what they are for.  It does not appear that she takes the Myrbetriq anymore for unknown reasons.  She is no longer taking the trimethoprim because it was a "one-time deal. "  She is also not taking her cranberry tablets.  She did state that she is using the vaginal estrogen cream 3 nights weekly, but she is using an applicator to apply the medication.  Her husband states that she has a difficult time with this and so I suspect that this is not being applied consistently.  Her husband states that she is still having pain in the right lower quadrant and continuously has issues with this " urination thing."  They want to get to the bottom of this.  From what I can gather, she has some suprapubic discomfort that seems to  be somewhat independent of urination.  She states it occurs after she has been sitting.  Her husband also states that she urinates all the time.  Patient denies any modifying or aggravating factors.  Patient denies any recent UTI's, gross hematuria or suprapubic/flank pain.  Patient denies any fevers, chills, nausea or vomiting.    UA patient had been taking Pyridium prescribed by her PCP yesterday.  I explained that this skews the urinalysis results and we will need to wait on culture.  PMH: Past Medical History:  Diagnosis Date   Anemia    Arthritis    History of colonic polyps    Hyperlipidemia    Osteoporosis    Rotator cuff tear    Vertigo, benign positional, unspecified laterality     Surgical History: Past Surgical History:  Procedure Laterality Date   ABDOMINAL HYSTERECTOMY     BCC REMOVAL     CARPAL TUNNEL RELEASE Right    COLONOSCOPY     COLONOSCOPY WITH PROPOFOL N/A 06/07/2019   Procedure: COLONOSCOPY WITH PROPOFOL;  Surgeon: Toledo, Boykin Nearing, MD;  Location: [facility] ENDOSCOPY;  Service: Gastroenterology;  Laterality: N/A;   ROTATOR CUFF SURGERY     THUMB TENDON RELEASE Right    TONSILLECTOMY      Home Medications:  Allergies as of 08/11/2023       Reactions   Codeine Other (See Comments)   Statins Other (See Comments)   MUSCLE PAIN        Medication List  Accurate as of August 11, 2023  8:37 AM. If you have any questions, ask your nurse or doctor.          aspirin EC 81 MG tablet Take by mouth.   Cranberry 500 MG Tabs Take 500 mg by mouth daily.   donepezil 5 MG tablet Commonly known as: ARICEPT Take by mouth.   estradiol 0.1 MG/GM vaginal cream Commonly known as: ESTRACE Apply one pea-sized amount around the opening of the urethra every Monday, Wednesday, and Friday.   mirabegron ER 25 MG Tb24 tablet Commonly known as: MYRBETRIQ Take 1 tablet (25 mg total) by mouth daily.   multivitamin tablet Take 1 tablet by mouth daily.    naproxen 500 MG tablet Commonly known as: NAPROSYN Take 500 mg by mouth 2 (two) times daily with a meal.   nitrofurantoin (macrocrystal-monohydrate) 100 MG capsule Commonly known as: MACROBID Take 1 capsule (100 mg total) by mouth every 12 (twelve) hours.   Omega-3 1000 MG Caps Take 2,000 mg by mouth daily.   omeprazole 40 MG capsule Commonly known as: PRILOSEC TAKE 1 CAPSULE BY MOUTH TWICE DAILY BEFORE MEAL(S) FOR 14 DAYS   trimethoprim 100 MG tablet Commonly known as: TRIMPEX Take 1 tablet (100 mg total) by mouth daily.        Allergies:  Allergies  Allergen Reactions   Codeine Other (See Comments)   Statins Other (See Comments)    MUSCLE PAIN     Family History: Family History  Problem Relation Age of Onset   Breast cancer Maternal Aunt     Social History:  reports that she has never smoked. She has never used smokeless tobacco. She reports that she does not drink alcohol and does not use drugs.  ROS: Pertinent ROS in HPI  Physical Exam: BP 117/75   Pulse 76   Ht 5\' 4"  (1.626 m)   Wt 120 lb (54.4 kg)   BMI 20.60 kg/m   Constitutional:  Well nourished. Alert and oriented, No acute distress. HEENT: [place] AT, moist mucus membranes.  Trachea midline Cardiovascular: No clubbing, cyanosis, or edema. Respiratory: Normal respiratory effort, no increased work of breathing. Neurologic: Grossly intact, no focal deficits, moving all 4 extremities. Psychiatric: Normal mood and affect.    Laboratory Data: Urinalysis See epic and HPI I have reviewed the labs.   Pertinent Imaging: N/A  Assessment & Plan:    1. Suspected UTI vs OAB symptoms  -UA w/ bacteria  -Urine culture pending -I explained that we will need to wait on culture results as they been taking the Pyridium and that can skew urinalysis results -Since they want to get to the bottom of this and she still having right lower quadrant pain and they are concerned that she may have been having an issue  with kidney stones, I will go ahead and order a CT renal stone study as well -I also advised them to start taking the cranberry tablets  2.  Urgency -I explained to the patient and her husband that some of her symptoms are likely due to OAB and that is what the Myrbetriq was prescribed for in the past -They had no recollection of the whether or not she had been on the medication, so I will go ahead and send in a prescription so they can get started on it again -restart Myrbetriq 25 mg daily  3.  Vaginal atrophy -continue vaginal Estrace cream to apply Monday, Wednesday and Friday  -Explained to Mrs. Kilgallon that she does  not need to use the applicator, but she can put a blueberry sized amount on her fingertip and apply it just inside the vaginal introitus 3 nights weekly  4.  Nephrolithiasis -Patient's husband is concerned that a kidney stone may be still present in her right ureter and he would like further investigation into this matter -We discussed that a CT scan would be the best test to rule out any nephrolithiasis or ureterolithiasis and he is in agreement and Mrs. Bonvillain is in agreement as well -CT renal stone study ordered   Return in about 3 weeks (around 09/01/2023) for CT report .  These notes generated with voice recognition software. I apologize for typographical errors.  Cloretta Ned  [facility] [address]  [address] [place], [place] [address] [phone]

## 2023-08-11 ENCOUNTER — Encounter: Payer: Self-pay | Admitting: Urology

## 2023-08-11 ENCOUNTER — Ambulatory Visit (INDEPENDENT_AMBULATORY_CARE_PROVIDER_SITE_OTHER): Payer: Medicare Other | Admitting: Urology

## 2023-08-11 VITALS — BP 117/75 | HR 76 | Ht 64.0 in | Wt 120.0 lb

## 2023-08-11 DIAGNOSIS — R1031 Right lower quadrant pain: Secondary | ICD-10-CM

## 2023-08-11 DIAGNOSIS — N952 Postmenopausal atrophic vaginitis: Secondary | ICD-10-CM | POA: Diagnosis not present

## 2023-08-11 DIAGNOSIS — R3915 Urgency of urination: Secondary | ICD-10-CM | POA: Diagnosis not present

## 2023-08-11 DIAGNOSIS — N200 Calculus of kidney: Secondary | ICD-10-CM

## 2023-08-11 DIAGNOSIS — R3989 Other symptoms and signs involving the genitourinary system: Secondary | ICD-10-CM | POA: Diagnosis not present

## 2023-08-11 LAB — URINALYSIS, COMPLETE
Bilirubin, UA: NEGATIVE
Leukocytes,UA: NEGATIVE
Nitrite, UA: POSITIVE — AB
RBC, UA: NEGATIVE
Specific Gravity, UA: 1.02 (ref 1.005–1.030)
Urobilinogen, Ur: 1 mg/dL (ref 0.2–1.0)
pH, UA: 7 (ref 5.0–7.5)

## 2023-08-11 LAB — MICROSCOPIC EXAMINATION

## 2023-08-11 MED ORDER — MIRABEGRON ER 25 MG PO TB24: 25.0000 mg | ORAL_TABLET | Freq: Every day | ORAL | 3 refills | Status: DC

## 2023-08-14 ENCOUNTER — Other Ambulatory Visit: Payer: Self-pay | Admitting: Urology

## 2023-08-14 DIAGNOSIS — N390 Urinary tract infection, site not specified: Secondary | ICD-10-CM

## 2023-08-14 LAB — CULTURE, URINE COMPREHENSIVE

## 2023-08-14 MED ORDER — CEFUROXIME AXETIL 250 MG PO TABS: 250.0000 mg | ORAL_TABLET | Freq: Two times a day (BID) | ORAL | 0 refills | Status: AC

## 2023-08-19 ENCOUNTER — Ambulatory Visit
Admission: RE | Admit: 2023-08-19 | Discharge: 2023-08-19 | Disposition: A | Discharge status: Home / Self Care | Payer: Medicare Other | Source: Ambulatory Visit | Attending: Urology | Admitting: Urology

## 2023-08-19 DIAGNOSIS — R1031 Right lower quadrant pain: Secondary | ICD-10-CM | POA: Insufficient documentation

## 2023-08-19 DIAGNOSIS — N200 Calculus of kidney: Secondary | ICD-10-CM | POA: Diagnosis present

## 2023-08-31 NOTE — Progress Notes (Unsigned)
09/03/2023 3:32 PM   Carmen Vazquez 05-24-1946 [id number]  Referring provider: Marina Goodell, MD [address] [place],  [place] [address]  Urological history: 1.  Recurrent UTI's -Contributing factors of age and vaginal atrophy -Documented urine cultures over the last year  August 11, 2023 - Klebsiella pneumoniae  August 10, 2023 - Mixed urogenital flora   November 11, 2022 - E. coli -Trimethoprim 100 mg daily, vaginal estrogen cream and cranberry  2.  Nephrolithiasis -CT (01/2022) right UVJ stone spontaneously passed  3. Renal cyst -CT (01/2022) - Benign Bosniak 1 renal cyst of the RIGHT kidney   4. High risk hematuria -non-smoker -CT pelvis (02/2022) -no worrisome findings -RUS (02/2022) -mild right sided hydronephrosis -CT (01/2022)  -right UVJ stone -cysto (08/2022) - NED  5. OAB -Contributing factors of age, vaginal atrophy, anxiety and LBP -Myrbetriq 25 mg daily  Chief Complaint  Patient presents with   Urinary Urgency   HPI: Carmen Vazquez is a 77 y.o. female who presents today for CT report with her husband, Carmen Vazquez.   Previous records reviewed.    At her visit 08/11/2023, history is obtained from both husband and patient.  They are both difficult historians as they are not completely sure what medications she is taking and what they are for.  It does not appear that she takes the Myrbetriq anymore for unknown reasons.  She is no longer taking the trimethoprim because it was a "one-time deal. "  She is also not taking her cranberry tablets.  She did state that she is using the vaginal estrogen cream 3 nights weekly, but she is using an applicator to apply the medication.  Her husband states that she has a difficult time with this and so I suspect that this is not being applied consistently.  Her husband states that she is still having pain in the right lower quadrant and continuously has issues with this " urination thing."  They want to get to the  bottom of this.  From what I can gather, she has some suprapubic discomfort that seems to be somewhat independent of urination.  She states it occurs after she has been sitting.  Her husband also states that she urinates all the time.  Patient denies any modifying or aggravating factors.  Patient denies any recent UTI's, gross hematuria or suprapubic/flank pain.  Patient denies any fevers, chills, nausea or vomiting.  UA patient had been taking Pyridium prescribed by her PCP yesterday.  I explained that this skews the urinalysis results and we will need to wait on culture.  Her urine culture grew out Klebsiella pneumoniae that she was placed on a 7-day regimen of culture appropriate antibiotics.  CT renal stone study completed on August 19, 2023 no renal stones, no ureteral stones, the previous right UVJ stone was no longer present.  There was no hydro.  And a right renal cyst noted.  She is having 8 or more daytime urinations with nocturia x 1-2 with strong urge to urinate.  She has both stress and urge incontinence.  She is leaking 1-2 times a week.  She wears 1 absorbent pads daily.  She does limit fluid intake and she does engage in toilet mapping.  Patient denies any modifying or aggravating factors.  Patient denies any recent UTI's, gross hematuria, dysuria or suprapubic/flank pain.  Patient denies any fevers, chills, nausea or vomiting.    PVR 0 mL   She has not been taking the Myrbetriq as  the prescription was sent in as a sample and insurance will not cover the medication.     PMH: Past Medical History:  Diagnosis Date   Anemia    Arthritis    History of colonic polyps    Hyperlipidemia    Osteoporosis    Rotator cuff tear    Vertigo, benign positional, unspecified laterality     Surgical History: Past Surgical History:  Procedure Laterality Date   ABDOMINAL HYSTERECTOMY     BCC REMOVAL     CARPAL TUNNEL RELEASE Right    COLONOSCOPY     COLONOSCOPY WITH PROPOFOL N/A  06/07/2019   Procedure: COLONOSCOPY WITH PROPOFOL;  Surgeon: Toledo, Boykin Nearing, MD;  Location: [facility] ENDOSCOPY;  Service: Gastroenterology;  Laterality: N/A;   ROTATOR CUFF SURGERY     THUMB TENDON RELEASE Right    TONSILLECTOMY      Home Medications:  Allergies as of 09/03/2023       Reactions   Codeine Other (See Comments)   Statins Other (See Comments)   MUSCLE PAIN        Medication List        Accurate as of September 03, 2023  3:32 PM. If you have any questions, ask your nurse or doctor.          STOP taking these medications    mirabegron ER 25 MG Tb24 tablet Commonly known as: MYRBETRIQ       TAKE these medications    aspirin EC 81 MG tablet Take by mouth.   Cranberry 500 MG Tabs Take 500 mg by mouth daily.   donepezil 5 MG tablet Commonly known as: ARICEPT Take by mouth.   estradiol 0.1 MG/GM vaginal cream Commonly known as: ESTRACE Apply one pea-sized amount around the opening of the urethra every Monday, Wednesday, and Friday.   fesoterodine 8 MG Tb24 tablet Commonly known as: TOVIAZ Take 1 tablet (8 mg total) by mouth daily.   multivitamin tablet Take 1 tablet by mouth daily.   naproxen 500 MG tablet Commonly known as: NAPROSYN Take 500 mg by mouth 2 (two) times daily with a meal.   Omega-3 1000 MG Caps Take 2,000 mg by mouth daily.   omeprazole 40 MG capsule Commonly known as: PRILOSEC TAKE 1 CAPSULE BY MOUTH TWICE DAILY BEFORE MEAL(S) FOR 14 DAYS   ondansetron 4 MG disintegrating tablet Commonly known as: ZOFRAN-ODT Take 1 tablet (4 mg total) by mouth every 8 (eight) hours as needed.        Allergies:  Allergies  Allergen Reactions   Codeine Other (See Comments)   Statins Other (See Comments)    MUSCLE PAIN     Family History: Family History  Problem Relation Age of Onset   Breast cancer Maternal Aunt     Social History:  reports that she has never smoked. She has never used smokeless tobacco. She reports that she  does not drink alcohol and does not use drugs.  ROS: Pertinent ROS in HPI  Physical Exam: BP 125/79   Pulse 84   Ht 5\' 4"  (1.626 m)   Wt 127 lb 5 oz (57.7 kg)   BMI 21.85 kg/m   Constitutional:  Well nourished. Alert and oriented, No acute distress. HEENT: [place] AT, moist mucus membranes.  Trachea midline Cardiovascular: No clubbing, cyanosis, or edema. Respiratory: Normal respiratory effort, no increased work of breathing. Neurologic: Grossly intact, no focal deficits, moving all 4 extremities. Psychiatric: Normal mood and affect.    Laboratory Data: N/A  Pertinent Imaging: Narrative & Impression  CLINICAL DATA:  Flank pain.  Dysuria.  Nephrolithiasis.   EXAM: CT ABDOMEN AND PELVIS WITHOUT CONTRAST   TECHNIQUE: Multidetector CT imaging of the abdomen and pelvis was performed following the standard protocol without IV contrast.   RADIATION DOSE REDUCTION: This exam was performed according to the departmental dose-optimization program which includes automated exposure control, adjustment of the mA and/or kV according to patient size and/or use of iterative reconstruction technique.   COMPARISON:  02/14/2022   FINDINGS: Lower chest: No acute findings.   Hepatobiliary: No mass visualized on this unenhanced exam. Gallbladder is unremarkable. No evidence of biliary ductal dilatation.   Pancreas: No mass or inflammatory process visualized on this unenhanced exam.   Spleen:  Within normal limits in size.   Adrenals/Urinary tract: No evidence of urolithiasis or hydronephrosis. Unremarkable unopacified urinary bladder.   Stomach/Bowel: No evidence of obstruction, inflammatory process, or abnormal fluid collections. Diverticulosis is seen mainly involving the sigmoid colon, however there is no evidence of diverticulitis. Normal appendix visualized.   Vascular/Lymphatic: No pathologically enlarged lymph nodes identified. No evidence of abdominal aortic aneurysm.    Reproductive: Prior hysterectomy noted. Adnexal regions are unremarkable in appearance.   Other:  None.   Musculoskeletal:  No suspicious bone lesions identified.   IMPRESSION: No evidence of urolithiasis, hydronephrosis, or other acute findings.   Colonic diverticulosis, without radiographic evidence of diverticulitis.     Electronically Signed   By: Danae Orleans M.D.   On: 08/28/2023 15:18     09/03/23 15:09  Scan Result 0 ml  I have reviewed the labs.  See HPI.     Assessment & Plan:    1. Suspected UTI vs OAB symptoms  -asymptomatic at this visit  2.  Urgency -she has not been taking the Myrbetriq and it will likely be cost prohibitive as her insurance will not cover it -Her insurance will cover the Toviaz, which anecdotally may not cause the dementia/Alzheimer risk as the other anticholinergics, I also explained that it will possibly hide dry mouth, dry eyes and constipation -I also discussed PTNS therapy as she does have an issue with constipation, but they live 35 miles outside of the office and will be difficult for them to return for weekly appointment -They like to go ahead with the Toviaz 8 mg daily sent to pharmacy   3.  Vaginal atrophy -continue vaginal Estrace cream to apply Monday, Wednesday and Friday   4.  Nephrolithiasis -No stones appreciated on recent CT renal stone study -Advised they reach out to their primary care provider for further evaluation of her right lower quadrant pain  Return in about 6 weeks (around 10/15/2023) for PVR and OAB questionnaire.  These notes generated with voice recognition software. I apologize for typographical errors.  Cloretta Ned  [facility] [address]  [address] [place], [place] [address] [phone]

## 2023-09-01 ENCOUNTER — Emergency Department
Admission: EM | Admit: 2023-09-01 | Discharge: 2023-09-01 | Disposition: A | Discharge status: Home / Self Care | Payer: Medicare Other | Attending: Emergency Medicine | Admitting: Emergency Medicine

## 2023-09-01 ENCOUNTER — Other Ambulatory Visit: Payer: Self-pay

## 2023-09-01 DIAGNOSIS — K2100 Gastro-esophageal reflux disease with esophagitis, without bleeding: Secondary | ICD-10-CM | POA: Diagnosis present

## 2023-09-01 LAB — COMPREHENSIVE METABOLIC PANEL
ALT: 18 U/L (ref 0–44)
AST: 24 U/L (ref 15–41)
Albumin: 4 g/dL (ref 3.5–5.0)
Alkaline Phosphatase: 54 U/L (ref 38–126)
Anion gap: 7 (ref 5–15)
BUN: 17 mg/dL (ref 8–23)
CO2: 24 mmol/L (ref 22–32)
Calcium: 8.9 mg/dL (ref 8.9–10.3)
Chloride: 105 mmol/L (ref 98–111)
Creatinine, Ser: 0.59 mg/dL (ref 0.44–1.00)
GFR, Estimated: >60 mL/min (ref >60)
Glucose, Bld: 127 mg/dL — ABNORMAL HIGH (ref 70–99)
Potassium: 4.3 mmol/L (ref 3.5–5.1)
Sodium: 136 mmol/L (ref 135–145)
Total Bilirubin: 1.3 mg/dL — ABNORMAL HIGH (ref <1.2)
Total Protein: 6.9 g/dL (ref 6.5–8.1)

## 2023-09-01 LAB — LIPASE, BLOOD: Lipase: 30 U/L (ref 11–51)

## 2023-09-01 LAB — CBC
HCT: 41.5 % (ref 36.0–46.0)
Hemoglobin: 13.2 g/dL (ref 12.0–15.0)
MCH: 31.4 pg (ref 26.0–34.0)
MCHC: 31.8 g/dL (ref 30.0–36.0)
MCV: 98.8 fL (ref 80.0–100.0)
Platelets: 274 10*3/uL (ref 150–400)
RBC: 4.2 MIL/uL (ref 3.87–5.11)
RDW: 12.3 % (ref 11.5–15.5)
WBC: 10.4 10*3/uL (ref 4.0–10.5)
nRBC: 0 % (ref 0.0–0.2)

## 2023-09-01 LAB — TROPONIN I (HIGH SENSITIVITY): Troponin I (High Sensitivity): 3 ng/L (ref <18)

## 2023-09-01 MED ORDER — ONDANSETRON 4 MG PO TBDP: 4.0000 mg | ORAL_TABLET | Freq: Three times a day (TID) | ORAL | 0 refills | Status: DC | PRN

## 2023-09-01 NOTE — ED Notes (Signed)
See triage notes. Patient c/o GERD for the last month.

## 2023-09-01 NOTE — ED Provider Notes (Signed)
[facility] Provider Note    Event Date/Time   First MD Initiated Contact with Patient 09/01/23 1351     (approximate)   History   Gastroesophageal Reflux   HPI  Carmen Vazquez is a 77 y.o. female with a history of anemia, hyperlipidemia who presents with complaints of acid reflux.  Patient reports over the last week she has been having frequent episodes of indigestion after eating in particular.  Denies chest pain or shortness of breath.  Feels that there is acid in the back of her throat.  Went to urgent care recently and was started on sucralfate, Augmentin     Physical Exam   Triage Vital Signs: ED Triage Vitals  Encounter Vitals Group     BP 09/01/23 1308 98/75     Systolic BP Percentile --      Diastolic BP Percentile --      Pulse Rate 09/01/23 1308 98     Resp 09/01/23 1308 17     Temp 09/01/23 1308 98 F (36.7 C)     Temp Source 09/01/23 1308 Oral     SpO2 09/01/23 1308 98 %     Weight 09/01/23 1309 53.5 kg (118 lb)     Height 09/01/23 1309 1.626 m (5\' 4" )     Head Circumference --      Peak Flow --      Pain Score 09/01/23 1309 0     Pain Loc --      Pain Education --      Exclude from Growth Chart --     Most recent vital signs: Vitals:   09/01/23 1308  BP: 98/75  Pulse: 98  Resp: 17  Temp: 98 F (36.7 C)  SpO2: 98%     General: Awake, no distress.  CV:  Good peripheral perfusion.  Resp:  Normal effort.  Abd:  No distention.  Soft but nontender Other:     ED Results / Procedures / Treatments   Labs (all labs ordered are listed, but only abnormal results are displayed) Labs Reviewed  COMPREHENSIVE METABOLIC PANEL - Abnormal; Notable for the following components:      Result Value   Glucose, Bld 127 (*)    Total Bilirubin 1.3 (*)    All other components within normal limits  LIPASE, BLOOD  CBC  URINALYSIS, ROUTINE W REFLEX MICROSCOPIC  TROPONIN I (HIGH SENSITIVITY)      EKG        PROCEDURES:  Critical Care performed:   Procedures   MEDICATIONS ORDERED IN ED: Medications - No data to display   IMPRESSION / MDM / ASSESSMENT AND PLAN / ED COURSE  I reviewed the triage vital signs and the nursing notes. Patient's presentation is most consistent with acute presentation with potential threat to life or bodily function.  Patient presents with complaints of acid reflux, indigestion as described above, occasional burning in the chest, none currently or today.  Overall well-appearing at this time and asymptomatic.  Does not appear consistent with ACS, do suspect acid reflux as primary cause, possible sinus drainage which is why she was put on Augmentin by urgent care.  Lab work obtained which is reassuring, high sensitive troponin is normal, CBC CMP unremarkable  She is already on Protonix, will place referral to GI, no indication for admission at this time, return precautions discussed, she and her husband agree with this plan.        FINAL CLINICAL IMPRESSION(S) / ED DIAGNOSES  Final diagnoses:  Gastroesophageal reflux disease with esophagitis without hemorrhage     Rx / DC Orders   ED Discharge Orders          Ordered    Ambulatory referral to Gastroenterology        09/01/23 1423             Note:  This document was prepared using Dragon voice recognition software and may include unintentional dictation errors.   Jene Every, MD 09/01/23 1501

## 2023-09-01 NOTE — ED Triage Notes (Signed)
Pt sts that she has been struggling with GERD for the last month. Pt sts that she was just at her PCP and was prescribed medication.

## 2023-09-03 ENCOUNTER — Ambulatory Visit: Payer: Medicare Other | Admitting: Urology

## 2023-09-03 ENCOUNTER — Encounter: Payer: Self-pay | Admitting: Urology

## 2023-09-03 VITALS — BP 125/79 | HR 84 | Ht 64.0 in | Wt 127.3 lb

## 2023-09-03 DIAGNOSIS — R3915 Urgency of urination: Secondary | ICD-10-CM | POA: Diagnosis not present

## 2023-09-03 DIAGNOSIS — N200 Calculus of kidney: Secondary | ICD-10-CM

## 2023-09-03 DIAGNOSIS — N952 Postmenopausal atrophic vaginitis: Secondary | ICD-10-CM

## 2023-09-03 DIAGNOSIS — R1031 Right lower quadrant pain: Secondary | ICD-10-CM

## 2023-09-03 DIAGNOSIS — Z87442 Personal history of urinary calculi: Secondary | ICD-10-CM

## 2023-09-03 LAB — BLADDER SCAN AMB NON-IMAGING: Scan Result: 0

## 2023-09-03 MED ORDER — FESOTERODINE FUMARATE ER 8 MG PO TB24: 8.0000 mg | ORAL_TABLET | Freq: Every day | ORAL | 0 refills | Status: DC

## 2023-09-24 ENCOUNTER — Telehealth: Payer: Self-pay

## 2023-09-24 NOTE — Telephone Encounter (Signed)
The patient and her husband Jonny Ruiz) called in to get the appointment date and time. I mailed out an appointment reminder.

## 2023-10-12 NOTE — Progress Notes (Signed)
10/15/2023 3:05 PM   Carmen Vazquez October 22, 1945 [id number]  Referring provider: Marina Goodell, MD [address] [place],  [place] [address]  Urological history: 1.  Recurrent UTI's -Contributing factors of age and vaginal atrophy -Documented urine cultures over the last year  August 11, 2023 - Klebsiella pneumoniae  August 10, 2023 - Mixed urogenital flora   November 11, 2022 - E. coli -Trimethoprim 100 mg daily, vaginal estrogen cream and cranberry  2.  Nephrolithiasis -CT (01/2022) right UVJ stone spontaneously passed  3. Renal cyst -CT (01/2022) - Benign Bosniak 1 renal cyst of the RIGHT kidney   4. High risk hematuria -non-smoker -CT pelvis (02/2022) -no worrisome findings -RUS (02/2022) -mild right sided hydronephrosis -CT (01/2022)  -right UVJ stone -cysto (08/2022) - NED  5. OAB -Contributing factors of age, vaginal atrophy, anxiety and LBP -Myrbetriq 25 mg daily  Chief Complaint  Patient presents with   Advice Only   HPI: Carmen Vazquez is a 78 y.o. female who presents today f for 6-week follow-up  with her husband, Carmen Vazquez. after a trial of Toviaz for her urgency.  Previous records reviewed.    At her visit 08/11/2023, history is obtained from both husband and patient.  They are both difficult historians as they are not completely sure what medications she is taking and what they are for.  It does not appear that she takes the Myrbetriq anymore for unknown reasons.  She is no longer taking the trimethoprim because it was a "one-time deal. "  She is also not taking her cranberry tablets.  She did state that she is using the vaginal estrogen cream 3 nights weekly, but she is using an applicator to apply the medication.  Her husband states that she has a difficult time with this and so I suspect that this is not being applied consistently.  Her husband states that she is still having pain in the right lower quadrant and continuously has issues with this  " urination thing."  They want to get to the bottom of this.  From what I can gather, she has some suprapubic discomfort that seems to be somewhat independent of urination.  She states it occurs after she has been sitting.  Her husband also states that she urinates all the time.  Patient denies any modifying or aggravating factors.  Patient denies any recent UTI's, gross hematuria or suprapubic/flank pain.  Patient denies any fevers, chills, nausea or vomiting.  UA patient had been taking Pyridium prescribed by her PCP yesterday.  I explained that this skews the urinalysis results and we will need to wait on culture.  Her urine culture grew out Klebsiella pneumoniae that she was placed on a 7-day regimen of culture appropriate antibiotics.  At her visit on  09/03/2023, CT renal stone study completed on August 19, 2023 no renal stones, no ureteral stones, the previous right UVJ stone was no longer present.  There was no hydro.  And a right renal cyst noted.She is having 8 or more daytime urinations with nocturia x 1-2 with strong urge to urinate.  She has both stress and urge incontinence.  She is leaking 1-2 times a week.  She wears 1 absorbent pads daily.  She does limit fluid intake and she does engage in toilet mapping.Patient denies any modifying or aggravating factors.  Patient denies any recent UTI's, gross hematuria, dysuria or suprapubic/flank pain.  Patient denies any fevers, chills, nausea or vomiting.  PVR 0 mL She  has not been taking the Myrbetriq as the prescription was sent in as a sample and insurance will not cover the medication.  A prescription for Toviaz 8 mg daily was sent in instead and she presents today for follow-up.  She has had 8 or more daytime voids with 1-2 episodes of nocturia with strong urge to urinate.  She does not have urinary leakage.  She does engage in toilet mapping.   Patient denies any modifying or aggravating factors.  Patient denies any recent UTI's, gross hematuria,  dysuria or suprapubic/flank pain.  Patient denies any fevers, chills, nausea or vomiting.    PVR 0 mL     PMH: Past Medical History:  Diagnosis Date   Anemia    Arthritis    History of colonic polyps    Hyperlipidemia    Osteoporosis    Rotator cuff tear    Vertigo, benign positional, unspecified laterality     Surgical History: Past Surgical History:  Procedure Laterality Date   ABDOMINAL HYSTERECTOMY     BCC REMOVAL     CARPAL TUNNEL RELEASE Right    COLONOSCOPY     COLONOSCOPY WITH PROPOFOL N/A 06/07/2019   Procedure: COLONOSCOPY WITH PROPOFOL;  Surgeon: Toledo, Boykin Nearing, MD;  Location: [facility] ENDOSCOPY;  Service: Gastroenterology;  Laterality: N/A;   ROTATOR CUFF SURGERY     THUMB TENDON RELEASE Right    TONSILLECTOMY      Home Medications:  Allergies as of 10/15/2023       Reactions   Codeine Other (See Comments)   Statins Other (See Comments)   MUSCLE PAIN        Medication List        Accurate as of October 15, 2023  3:05 PM. If you have any questions, ask your nurse or doctor.          aspirin EC 81 MG tablet Take by mouth.   Cranberry 500 MG Tabs Take 500 mg by mouth daily.   donepezil 5 MG tablet Commonly known as: ARICEPT Take by mouth.   estradiol 0.1 MG/GM vaginal cream Commonly known as: ESTRACE Apply one pea-sized amount around the opening of the urethra every Monday, Wednesday, and Friday.   fesoterodine 8 MG Tb24 tablet Commonly known as: TOVIAZ Take 1 tablet (8 mg total) by mouth daily. What changed: Another medication with the same name was added. Make sure you understand how and when to take each. Changed by: Michiel Cowboy   fesoterodine 8 MG Tb24 tablet Commonly known as: TOVIAZ Take 1 tablet (8 mg total) by mouth daily. What changed: You were already taking a medication with the same name, and this prescription was added. Make sure you understand how and when to take each. Changed by: Michiel Cowboy   multivitamin  tablet Take 1 tablet by mouth daily.   naproxen 500 MG tablet Commonly known as: NAPROSYN Take 500 mg by mouth 2 (two) times daily with a meal.   Omega-3 1000 MG Caps Take 2,000 mg by mouth daily.   omeprazole 40 MG capsule Commonly known as: PRILOSEC TAKE 1 CAPSULE BY MOUTH TWICE DAILY BEFORE MEAL(S) FOR 14 DAYS   ondansetron 4 MG disintegrating tablet Commonly known as: ZOFRAN-ODT Take 1 tablet (4 mg total) by mouth every 8 (eight) hours as needed.        Allergies:  Allergies  Allergen Reactions   Codeine Other (See Comments)   Statins Other (See Comments)    MUSCLE PAIN     Family  History: Family History  Problem Relation Age of Onset   Breast cancer Maternal Aunt     Social History:  reports that she has never smoked. She has never used smokeless tobacco. She reports that she does not drink alcohol and does not use drugs.  ROS: Pertinent ROS in HPI  Physical Exam: Ht 5\' 4"  (1.626 m)   Wt 127 lb (57.6 kg)   BMI 21.80 kg/m   Constitutional:  Well nourished. Alert and oriented, No acute distress. HEENT: [place] AT, moist mucus membranes.  Trachea midline, no masses. Cardiovascular: No clubbing, cyanosis, or edema. Respiratory: Normal respiratory effort, no increased work of breathing. Neurologic: Grossly intact, no focal deficits, moving all 4 extremities. Psychiatric: Normal mood and affect.    Laboratory Data: N/A  Pertinent Imaging: Component     Latest Ref Rng 10/15/2023  PVR     WU 0.0      Assessment & Plan:    1. Suspected UTI vs OAB symptoms  -asymptomatic at this visit  2.  Urgency -They did not pick up the Toviaz 8 mg daily, so I do not know if it was effective, she still having the urgency  3.  Vaginal atrophy -continue vaginal Estrace cream to apply Monday, Wednesday and Friday    Return in about 3 months (around 01/13/2024) for OAB, PVR .  These notes generated with voice recognition software. I apologize for typographical  errors.  Cloretta Ned  [facility] [address]  [address] [place], [place] [address] [phone]

## 2023-10-15 ENCOUNTER — Encounter: Payer: Self-pay | Admitting: Urology

## 2023-10-15 ENCOUNTER — Ambulatory Visit: Payer: Medicare Other | Admitting: Urology

## 2023-10-15 VITALS — Ht 64.0 in | Wt 127.0 lb

## 2023-10-15 DIAGNOSIS — N952 Postmenopausal atrophic vaginitis: Secondary | ICD-10-CM | POA: Diagnosis not present

## 2023-10-15 DIAGNOSIS — Z87442 Personal history of urinary calculi: Secondary | ICD-10-CM | POA: Diagnosis not present

## 2023-10-15 DIAGNOSIS — R3915 Urgency of urination: Secondary | ICD-10-CM | POA: Diagnosis not present

## 2023-10-15 DIAGNOSIS — N200 Calculus of kidney: Secondary | ICD-10-CM

## 2023-10-15 DIAGNOSIS — Z87898 Personal history of other specified conditions: Secondary | ICD-10-CM | POA: Diagnosis not present

## 2023-10-15 DIAGNOSIS — N3281 Overactive bladder: Secondary | ICD-10-CM

## 2023-10-15 LAB — BLADDER SCAN AMB NON-IMAGING: PVR: 0 WU

## 2023-10-15 MED ORDER — FESOTERODINE FUMARATE ER 8 MG PO TB24: 8.0000 mg | ORAL_TABLET | Freq: Every day | ORAL | 4 refills | Status: DC

## 2023-11-20 ENCOUNTER — Other Ambulatory Visit: Payer: Self-pay

## 2023-11-24 ENCOUNTER — Other Ambulatory Visit: Payer: Self-pay

## 2023-11-24 ENCOUNTER — Ambulatory Visit (INDEPENDENT_AMBULATORY_CARE_PROVIDER_SITE_OTHER): Payer: Medicare Other | Admitting: Gastroenterology

## 2023-11-24 ENCOUNTER — Encounter: Payer: Self-pay | Admitting: Gastroenterology

## 2023-11-24 VITALS — BP 119/75 | HR 85 | Temp 97.7°F | Ht 64.0 in | Wt 124.4 lb

## 2023-11-24 DIAGNOSIS — R1310 Dysphagia, unspecified: Secondary | ICD-10-CM

## 2023-11-24 DIAGNOSIS — R1319 Other dysphagia: Secondary | ICD-10-CM

## 2023-11-24 DIAGNOSIS — K219 Gastro-esophageal reflux disease without esophagitis: Secondary | ICD-10-CM | POA: Diagnosis not present

## 2023-11-24 MED ORDER — OMEPRAZOLE 40 MG PO CPDR: 40.0000 mg | DELAYED_RELEASE_CAPSULE | Freq: Every day | ORAL | 0 refills | Status: DC

## 2023-11-24 NOTE — Progress Notes (Signed)
 [name], MD [address]  [address]  [place], [place] [address]  Main: [phone]  Fax: [phone]    Gastroenterology Consultation  Referring Provider:     Marina Goodell, MD Primary Care Physician:  Marina Goodell, MD Primary Gastroenterologist:  Dr. [name] Reason for Consultation: Difficulty swallowing, heartburn        HPI:   Carmen Vazquez is a 78 y.o. female referred by Dr. Marina Goodell, MD  for consultation & management of worsening of symptoms of burning in the chest, epigastric discomfort, regurgitation of food as well as stomach contents.  She is also experiencing difficulty swallowing solid foods which is mild.  Patient is accompanied by her husband.  He states that his wife has been experiencing symptoms of indigestion all her life, however within last 1 year her symptoms have progressively worsened, she was managing her symptoms with over-the-counter antacids only.  Patient went to the ER on 09/01/2023 secondary to worsening of GERD symptoms, she was prescribed Protonix, provided some relief.  Subsequently, she was seen by her PCP She does not smoke or drink alcohol Never had an EGD in the past History of chronic constipation, takes MiraLAX as needed  NSAIDs: None  Antiplts/Anticoagulants/Anti thrombotics: None  GI Procedures:  Colonoscopy 06/07/2019 - Diverticulosis in the sigmoid colon. - One 4 mm polyp in the cecum, removed with a jumbo cold forceps. Resected and retrieved. - Non- bleeding internal hemorrhoids. - The examination was otherwise normal. DIAGNOSIS:  A. COLON POLYP, CECUM; COLD BIOPSY:  - TUBULAR ADENOMA.  - NEGATIVE FOR HIGH-GRADE DYSPLASIA AND MALIGNANCY.   Past Medical History:  Diagnosis Date   Anemia    Arthritis    History of colonic polyps    Hyperlipidemia    Osteoporosis    Rotator cuff tear    Vertigo, benign positional, unspecified laterality     Past Surgical History:  Procedure Laterality  Date   ABDOMINAL HYSTERECTOMY     BCC REMOVAL     CARPAL TUNNEL RELEASE Right    COLONOSCOPY     COLONOSCOPY WITH PROPOFOL N/A 06/07/2019   Procedure: COLONOSCOPY WITH PROPOFOL;  Surgeon: Toledo, Boykin Nearing, MD;  Location: [facility] ENDOSCOPY;  Service: Gastroenterology;  Laterality: N/A;   ROTATOR CUFF SURGERY     THUMB TENDON RELEASE Right    TONSILLECTOMY       Current Outpatient Medications:    acetaminophen (TYLENOL) 500 MG tablet, Take 500 mg by mouth every 4 (four) hours as needed., Disp: , Rfl:    aspirin EC 81 MG tablet, Take by mouth., Disp: , Rfl:    cimetidine (TAGAMET) 200 MG tablet, Take 200 mg by mouth at bedtime., Disp: , Rfl:    estradiol (ESTRACE) 0.1 MG/GM vaginal cream, Apply one pea-sized amount around the opening of the urethra every Monday, Wednesday, and Friday., Disp: 42.5 g, Rfl: 5   fesoterodine (TOVIAZ) 8 MG TB24 tablet, Take 1 tablet (8 mg total) by mouth daily., Disp: 30 tablet, Rfl: 4   memantine (NAMENDA) 10 MG tablet, Take 1 tablet by mouth 2 (two) times daily., Disp: , Rfl:    Multiple Vitamin (MULTIVITAMIN) tablet, Take 1 tablet by mouth daily., Disp: , Rfl:    naproxen (NAPROSYN) 500 MG tablet, Take 500 mg by mouth 2 (two) times daily with a meal., Disp: , Rfl:    Omega-3 1000 MG CAPS, Take 2,000 mg by mouth daily., Disp: , Rfl:    omeprazole (PRILOSEC) 40  MG capsule, Take 1 capsule (40 mg total) by mouth daily before breakfast., Disp: 30 capsule, Rfl: 0   sucralfate (CARAFATE) 1 g tablet, Take 1 g by mouth 4 (four) times daily., Disp: , Rfl:    donepezil (ARICEPT) 5 MG tablet, Take by mouth., Disp: , Rfl:    Family History  Problem Relation Age of Onset   Breast cancer Maternal Aunt      Social History   Tobacco Use   Smoking status: Never   Smokeless tobacco: Never  Vaping Use   Vaping status: Never Used  Substance Use Topics   Alcohol use: Never   Drug use: Never    Allergies as of 11/24/2023 - Review Complete 11/24/2023  Allergen Reaction  Noted   Codeine Other (See Comments) 06/03/2019   Statins Other (See Comments) 06/03/2019    Review of Systems:    All systems reviewed and negative except where noted in HPI.   Physical Exam:  BP 119/75 (BP Location: Left Arm, Patient Position: Sitting, Cuff Size: Normal)   Pulse 85   Temp 97.7 F (36.5 C) (Oral)   Ht 5\' 4"  (1.626 m)   Wt 124 lb 6 oz (56.4 kg)   BMI 21.35 kg/m  No LMP recorded. Patient has had a hysterectomy.  General:   Alert,  Well-developed, well-nourished, pleasant and cooperative in NAD Head:  Normocephalic and atraumatic. Eyes:  Sclera clear, no icterus.   Conjunctiva pink. Ears:  Normal auditory acuity. Nose:  No deformity, discharge, or lesions. Mouth:  No deformity or lesions,oropharynx pink & moist. Neck:  Supple; no masses or thyromegaly. Lungs:  Respirations even and unlabored.  Clear throughout to auscultation.   No wheezes, crackles, or rhonchi. No acute distress. Heart:  Regular rate and rhythm; no murmurs, clicks, rubs, or gallops. Abdomen:  Normal bowel sounds. Soft, non-tender and non-distended without masses, hepatosplenomegaly or hernias noted.  No guarding or rebound tenderness.   Rectal: Not performed Msk:  Symmetrical without gross deformities. Good, equal movement & strength bilaterally. Pulses:  Normal pulses noted. Extremities:  No clubbing or edema.  No cyanosis. Neurologic:  Alert and oriented x3;  grossly normal neurologically. Skin:  Intact without significant lesions or rashes. No jaundice. Psych:  Alert and cooperative. Normal mood and affect.  Imaging Studies: Reviewed  Assessment and Plan:   Carmen Vazquez is a 78 y.o. female with history of osteoporosis on Reclast is seen in consultation for chronic GERD symptoms with recent flareup as well as mild dysphagia to solids  Recommend omeprazole 40 mg once a day before breakfast Recommend EGD for further evaluation  Follow up based on the above workup   [name], MD

## 2023-11-25 ENCOUNTER — Encounter: Payer: Self-pay | Admitting: Gastroenterology

## 2023-11-25 NOTE — Anesthesia Preprocedure Evaluation (Addendum)
 Anesthesia Evaluation  Patient identified by MRN, date of birth, ID band Patient awake    Reviewed: Allergy & Precautions, H&P , NPO status , Patient's Chart, lab work & pertinent test results  History of Anesthesia Complications (+) PONV and history of anesthetic complications  Airway Mallampati: III  TM Distance: <3 FB Neck ROM: Full   Comment: Short TMD Dental no notable dental hx.    Pulmonary neg pulmonary ROS   Pulmonary exam normal breath sounds clear to auscultation       Cardiovascular negative cardio ROS Normal cardiovascular exam Rhythm:Regular Rate:Normal     Neuro/Psych  Headaches PSYCHIATRIC DISORDERS Anxiety     negative neurological ROS  negative psych ROS   GI/Hepatic negative GI ROS, Neg liver ROS,GERD  ,,  Endo/Other  negative endocrine ROS    Renal/GU negative Renal ROS  negative genitourinary   Musculoskeletal negative musculoskeletal ROS (+) Arthritis ,    Abdominal   Peds negative pediatric ROS (+)  Hematology negative hematology ROS (+) Blood dyscrasia, anemia   Anesthesia Other Findings Anemia  Arthritis Vertigo, benign positional, unspecified laterality  History of colonic polyps Hyperlipidemia  Osteoporosis Rotator cuff tear  PONV (postoperative nausea and vomiting) Chronic GERD  Generalized anxiety disorder Headache disorder  Memory loss or impairment Husband with patient and filled in any necessary answers.    Reproductive/Obstetrics negative OB ROS                             Anesthesia Physical Anesthesia Plan  ASA: 2  Anesthesia Plan: General   Post-op Pain Management:    Induction: Intravenous  PONV Risk Score and Plan:   Airway Management Planned: Natural Airway and Nasal Cannula  Additional Equipment:   Intra-op Plan:   Post-operative Plan:   Informed Consent: I have reviewed the patients History and Physical, chart, labs and  discussed the procedure including the risks, benefits and alternatives for the proposed anesthesia with the patient or authorized representative who has indicated his/her understanding and acceptance.     Dental Advisory Given  Plan Discussed with: Anesthesiologist, CRNA and Surgeon  Anesthesia Plan Comments: (Patient consented for risks of anesthesia including but not limited to:  - adverse reactions to medications - risk of airway placement if required - damage to eyes, teeth, lips or other oral mucosa - nerve damage due to positioning  - sore throat or hoarseness - Damage to heart, brain, nerves, lungs, other parts of body or loss of life  Patient voiced understanding and assent.)        Anesthesia Quick Evaluation

## 2023-11-30 ENCOUNTER — Other Ambulatory Visit: Payer: Self-pay

## 2023-11-30 ENCOUNTER — Encounter: Payer: Self-pay | Admitting: Gastroenterology

## 2023-11-30 ENCOUNTER — Encounter
Admission: RE | Disposition: A | Discharge status: Home / Self Care | Payer: Self-pay | Source: Home / Self Care | Attending: Gastroenterology

## 2023-11-30 ENCOUNTER — Ambulatory Visit: Payer: Self-pay | Admitting: Anesthesiology

## 2023-11-30 ENCOUNTER — Ambulatory Visit
Admission: RE | Admit: 2023-11-30 | Discharge: 2023-11-30 | Disposition: A | Discharge status: Home / Self Care | Payer: Medicare Other | Attending: Gastroenterology | Admitting: Gastroenterology

## 2023-11-30 DIAGNOSIS — K219 Gastro-esophageal reflux disease without esophagitis: Secondary | ICD-10-CM | POA: Insufficient documentation

## 2023-11-30 DIAGNOSIS — D649 Anemia, unspecified: Secondary | ICD-10-CM | POA: Insufficient documentation

## 2023-11-30 DIAGNOSIS — K449 Diaphragmatic hernia without obstruction or gangrene: Secondary | ICD-10-CM | POA: Diagnosis not present

## 2023-11-30 DIAGNOSIS — R1310 Dysphagia, unspecified: Secondary | ICD-10-CM | POA: Diagnosis present

## 2023-11-30 DIAGNOSIS — R519 Headache, unspecified: Secondary | ICD-10-CM | POA: Diagnosis not present

## 2023-11-30 DIAGNOSIS — M1990 Unspecified osteoarthritis, unspecified site: Secondary | ICD-10-CM | POA: Diagnosis not present

## 2023-11-30 DIAGNOSIS — R1314 Dysphagia, pharyngoesophageal phase: Secondary | ICD-10-CM | POA: Diagnosis not present

## 2023-11-30 HISTORY — PX: ESOPHAGOGASTRODUODENOSCOPY (EGD) WITH PROPOFOL: SHX5813

## 2023-11-30 HISTORY — DX: Gastro-esophageal reflux disease without esophagitis: K21.9

## 2023-11-30 HISTORY — DX: Other specified postprocedural states: Z98.890

## 2023-11-30 HISTORY — DX: Other amnesia: R41.3

## 2023-11-30 HISTORY — DX: Generalized anxiety disorder: F41.1

## 2023-11-30 HISTORY — DX: Headache, unspecified: R51.9

## 2023-11-30 SURGERY — ESOPHAGOGASTRODUODENOSCOPY (EGD) WITH PROPOFOL
Anesthesia: General | Site: Esophagus

## 2023-11-30 MED ORDER — PROPOFOL 10 MG/ML IV BOLUS
INTRAVENOUS | Status: AC
  Filled 2023-11-30: qty 20

## 2023-11-30 MED ORDER — LIDOCAINE HCL (CARDIAC) PF 100 MG/5ML IV SOSY
PREFILLED_SYRINGE | INTRAVENOUS | Status: DC | PRN
  Administered 2023-11-30: 50 mg via INTRAVENOUS

## 2023-11-30 MED ORDER — PROPOFOL 10 MG/ML IV BOLUS
INTRAVENOUS | Status: AC
  Filled 2023-11-30: qty 40

## 2023-11-30 MED ORDER — PROPOFOL 10 MG/ML IV BOLUS
INTRAVENOUS | Status: DC | PRN
  Administered 2023-11-30 (×2): 40 mg via INTRAVENOUS

## 2023-11-30 MED ORDER — STERILE WATER FOR IRRIGATION IR SOLN
Status: DC | PRN
  Administered 2023-11-30: 1

## 2023-11-30 MED ORDER — SODIUM CHLORIDE 0.9 % IV SOLN: INTRAVENOUS | Status: DC

## 2023-11-30 MED ORDER — LIDOCAINE HCL (PF) 2 % IJ SOLN
INTRAMUSCULAR | Status: AC
  Filled 2023-11-30: qty 5

## 2023-11-30 SURGICAL SUPPLY — 5 items
BLOCK BITE 60FR ADLT L/F GRN (MISCELLANEOUS) ×1 IMPLANT
GOWN CVR UNV OPN BCK APRN NK (MISCELLANEOUS) ×2 IMPLANT
KIT PRC NS LF DISP ENDO (KITS) ×1 IMPLANT
MANIFOLD NEPTUNE II (INSTRUMENTS) ×1 IMPLANT
WATER STERILE IRR 250ML POUR (IV SOLUTION) ×1 IMPLANT

## 2023-11-30 NOTE — Op Note (Signed)
 [facility] Gastroenterology Patient Name: Carmen Vazquez Procedure Date: 11/30/2023 9:34 AM MRN: [id number] Account #: [id number] Date of Birth: 1946-03-07 Admit Type: Outpatient Age: 78 Room: [facility] OR ROOM 01 Gender: Female Note Status: Finalized Instrument Name: [id number] Procedure:             Upper GI endoscopy Indications:           Esophageal dysphagia, Follow-up of gastro-esophageal                         reflux disease Providers:             Toney Reil MD, MD Referring MD:          Marina Goodell (Referring MD) Medicines:             General Anesthesia Complications:         No immediate complications. Estimated blood loss: None. Procedure:             Pre-Anesthesia Assessment:                        - Prior to the procedure, a History and Physical was                         performed, and patient medications and allergies were                         reviewed. The patient is competent. The risks and                         benefits of the procedure and the sedation options and                         risks were discussed with the patient. All questions                         were answered and informed consent was obtained.                         Patient identification and proposed procedure were                         verified by the physician, the nurse, the                         anesthesiologist, the anesthetist and the technician                         in the pre-procedure area in the procedure room in the                         endoscopy suite. Mental Status Examination: alert and                         oriented. Airway Examination: normal oropharyngeal                         airway and neck mobility. Respiratory Examination:  clear to auscultation. CV Examination: normal.                         Prophylactic Antibiotics: The patient does not require                         prophylactic antibiotics. Prior  Anticoagulants: The                         patient has taken no anticoagulant or antiplatelet                         agents. ASA Grade Assessment: II - A patient with mild                         systemic disease. After reviewing the risks and                         benefits, the patient was deemed in satisfactory                         condition to undergo the procedure. The anesthesia                         plan was to use general anesthesia. Immediately prior                         to administration of medications, the patient was                         re-assessed for adequacy to receive sedatives. The                         heart rate, respiratory rate, oxygen saturations,                         blood pressure, adequacy of pulmonary ventilation, and                         response to care were monitored throughout the                         procedure. The physical status of the patient was                         re-assessed after the procedure.                        After obtaining informed consent, the endoscope was                         passed under direct vision. Throughout the procedure,                         the patient's blood pressure, pulse, and oxygen                         saturations were monitored continuously. The Endoscope  was introduced through the mouth, and advanced to the                         second part of duodenum. The upper GI endoscopy was                         accomplished without difficulty. The patient tolerated                         the procedure well. Findings:      The duodenal bulb and second portion of the duodenum were normal.      The entire examined stomach was normal.      A small hiatal hernia was present.      The cardia and gastric fundus were normal on retroflexion.      The gastroesophageal junction and examined esophagus were normal.      Esophagogastric landmarks were identified: the  gastroesophageal junction       was found at 35 cm from the incisors. Impression:            - Normal duodenal bulb and second portion of the                         duodenum.                        - Normal stomach.                        - Small hiatal hernia.                        - Normal gastroesophageal junction and esophagus.                        - Esophagogastric landmarks identified.                        - No specimens collected. Recommendation:        - Discharge patient to home (with escort).                        - Resume previous diet today.                        - Continue present medications.                        - Follow an antireflux regimen indefinitely. Procedure Code(s):     --- Professional ---                        [phone], Esophagogastroduodenoscopy, flexible,                         transoral; diagnostic, including collection of                         specimen(s) by brushing or washing, when performed                         (separate procedure) Diagnosis Code(s):     ---  Professional ---                        K44.9, Diaphragmatic hernia without obstruction or                         gangrene                        R13.14, Dysphagia, pharyngoesophageal phase                        K21.9, Gastro-esophageal reflux disease without                         esophagitis CPT copyright 2022 [employer] Association. All rights reserved. The codes documented in this report are preliminary and upon coder review may  be revised to meet current compliance requirements. Dr. Libby Maw Toney Reil MD, MD 11/30/2023 9:49:57 AM This report has been signed electronically. Number of Addenda: 0 Note Initiated On: 11/30/2023 9:34 AM Total Procedure Duration: 0 hours 4 minutes 53 seconds  Estimated Blood Loss:  Estimated blood loss: none.      [facility]

## 2023-11-30 NOTE — H&P (Signed)
 [name], MD [address]  [address]  [place], [place] [address]  Main: [phone]  Fax: [phone] Pager: [phone]  Primary Care Physician:  Marina Goodell, MD Primary Gastroenterologist:  Dr. [name]  Pre-Procedure History & Physical: HPI:  Carmen Vazquez is a 78 y.o. female is here for an endoscopy.   Past Medical History:  Diagnosis Date   Anemia    Arthritis    Chronic GERD    Generalized anxiety disorder    Headache disorder    History of colonic polyps    Hyperlipidemia    Memory loss or impairment    Osteoporosis    PONV (postoperative nausea and vomiting)    Rotator cuff tear    Vertigo, benign positional, unspecified laterality     Past Surgical History:  Procedure Laterality Date   ABDOMINAL HYSTERECTOMY     BCC REMOVAL     CARPAL TUNNEL RELEASE Right    COLONOSCOPY     COLONOSCOPY WITH PROPOFOL N/A 06/07/2019   Procedure: COLONOSCOPY WITH PROPOFOL;  Surgeon: Toledo, Boykin Nearing, MD;  Location: [facility];  Service: Gastroenterology;  Laterality: N/A;   ROTATOR CUFF SURGERY     THUMB TENDON RELEASE Right    TONSILLECTOMY      Prior to Admission medications   Medication Sig Start Date End Date Taking? Authorizing Provider  acetaminophen (TYLENOL) 500 MG tablet Take 500 mg by mouth every 4 (four) hours as needed.   Yes [provider]  Apoaequorin (PREVAGEN PO) Take by mouth.   Yes [provider]  aspirin EC 81 MG tablet Take by mouth.   Yes [provider]  cimetidine (TAGAMET) 200 MG tablet Take 200 mg by mouth at bedtime.   Yes [provider]  fesoterodine (TOVIAZ) 8 MG TB24 tablet Take 1 tablet (8 mg total) by mouth daily. 10/15/23  Yes McGowan, Carollee Herter A, PA-C  memantine (NAMENDA) 10 MG tablet Take 1 tablet by mouth 2 (two) times daily. 10/05/23  Yes [provider]  Misc Natural Products (BEET ROOT PO) Take by mouth.   Yes [provider]  Multiple Vitamin  (MULTIVITAMIN) tablet Take 1 tablet by mouth daily.   Yes [provider]  naproxen (NAPROSYN) 500 MG tablet Take 500 mg by mouth 2 (two) times daily with a meal.   Yes [provider]  Omega-3 1000 MG CAPS Take 2,000 mg by mouth daily.   Yes [provider]  omeprazole (PRILOSEC) 40 MG capsule Take 1 capsule (40 mg total) by mouth daily before breakfast. 11/24/23  Yes Sherill Mangen, Loel Dubonnet, MD  donepezil (ARICEPT) 5 MG tablet Take by mouth. Patient not taking: Reported on 11/25/2023 02/05/22   [provider]  estradiol (ESTRACE) 0.1 MG/GM vaginal cream Apply one pea-sized amount around the opening of the urethra every Monday, Wednesday, and Friday. 04/20/23   Vaillancourt, Lelon Mast, PA-C  sucralfate (CARAFATE) 1 g tablet Take 1 g by mouth 4 (four) times daily. Patient not taking: Reported on 11/25/2023 08/31/23   [provider]    Allergies as of 11/24/2023 - Review Complete 11/24/2023  Allergen Reaction Noted   Codeine Other (See Comments) 06/03/2019   Statins Other (See Comments) 06/03/2019    Family History  Problem Relation Age of Onset   Breast cancer Maternal Aunt     Social History   Socioeconomic History   Marital status: Married    Spouse name: Not on file   Number of children: Not on  file   Years of education: Not on file   Highest education level: Not on file  Occupational History   Not on file  Tobacco Use   Smoking status: Never   Smokeless tobacco: Never  Vaping Use   Vaping status: Never Used  Substance and Sexual Activity   Alcohol use: Never   Drug use: Never   Sexual activity: Not on file  Other Topics Concern   Not on file  Social History Narrative   Not on file   Social Drivers of Health   Financial Resource Strain: Low Risk  (10/13/2023)   Received from [facility]   Overall Financial Resource Strain (CARDIA)    Difficulty of Paying Living Expenses: Not hard at all  Food Insecurity:  Unknown (10/13/2023)   Received from [facility]   Hunger Vital Sign    Worried About Running Out of Food in the Last Year: Never true    Ran Out of Food in the Last Year: Not on file  Transportation Needs: No Transportation Needs (10/13/2023)   Received from [facility] - Transportation    In the past 12 months, has lack of transportation kept you from medical appointments or from getting medications?: No    Lack of Transportation (Non-Medical): No  Physical Activity: Not on file  Stress: Not on file  Social Connections: Not on file  Intimate Partner Violence: Not on file    Review of Systems: See HPI, otherwise negative ROS  Physical Exam: BP 126/70   Pulse 73   Temp (!) 97.5 F (36.4 C) (Temporal)   Resp 18   Ht 5\' 4"  (1.626 m)   Wt 55.8 kg   SpO2 100%   BMI 21.11 kg/m  General:   Alert,  pleasant and cooperative in NAD Head:  Normocephalic and atraumatic. Neck:  Supple; no masses or thyromegaly. Lungs:  Clear throughout to auscultation.    Heart:  Regular rate and rhythm. Abdomen:  Soft, nontender and nondistended. Normal bowel sounds, without guarding, and without rebound.   Neurologic:  Alert and  oriented x4;  grossly normal neurologically.  Impression/Plan: Carmen Vazquez is here for an endoscopy to be performed for chronic gerd, dysphagia  Risks, benefits, limitations, and alternatives regarding  endoscopy have been reviewed with the patient.  Questions have been answered.  All parties agreeable.   Lannette Donath, MD  11/30/2023, 8:55 AM

## 2023-11-30 NOTE — Anesthesia Postprocedure Evaluation (Signed)
 Anesthesia Post Note  Patient: Carmen Vazquez  Procedure(s) Performed: ESOPHAGOGASTRODUODENOSCOPY (EGD) WITH PROPOFOL (Esophagus)  Patient location during evaluation: PACU Anesthesia Type: General Level of consciousness: awake and alert Pain management: pain level controlled Vital Signs Assessment: post-procedure vital signs reviewed and stable Respiratory status: spontaneous breathing, nonlabored ventilation, respiratory function stable and patient connected to nasal cannula oxygen Cardiovascular status: blood pressure returned to baseline and stable Postop Assessment: no apparent nausea or vomiting Anesthetic complications: no   No notable events documented.   Last Vitals:  Vitals:   11/30/23 1000 11/30/23 1004  BP: 105/67 110/68  Pulse: 78 71  Resp: 18 13  Temp:  (!) 36.1 C  SpO2: 98% 99%    Last Pain:  Vitals:   11/30/23 1004  TempSrc:   PainSc: 0-No pain                 Lejon Afzal C Amiel Sharrow

## 2023-11-30 NOTE — Transfer of Care (Signed)
 Immediate Anesthesia Transfer of Care Note  Patient: Carmen Vazquez  Procedure(s) Performed: ESOPHAGOGASTRODUODENOSCOPY (EGD) WITH PROPOFOL (Esophagus)  Patient Location: PACU  Anesthesia Type: General  Level of Consciousness: awake, alert  and patient cooperative  Airway and Oxygen Therapy: Patient Spontanous Breathing and Patient connected to supplemental oxygen  Post-op Assessment: Post-op Vital signs reviewed, Patient's Cardiovascular Status Stable, Respiratory Function Stable, Patent Airway and No signs of Nausea or vomiting  Post-op Vital Signs: Reviewed and stable  Complications: No notable events documented.

## 2023-12-01 ENCOUNTER — Encounter: Payer: Self-pay | Admitting: Gastroenterology

## 2023-12-11 ENCOUNTER — Other Ambulatory Visit: Payer: Self-pay | Admitting: Gastroenterology

## 2023-12-17 ENCOUNTER — Encounter: Payer: Self-pay | Admitting: Gastroenterology

## 2023-12-18 ENCOUNTER — Other Ambulatory Visit: Payer: Self-pay

## 2023-12-18 MED ORDER — OMEPRAZOLE 40 MG PO CPDR: 40.0000 mg | DELAYED_RELEASE_CAPSULE | Freq: Every day | ORAL | 11 refills | Status: AC

## 2024-01-19 NOTE — Progress Notes (Unsigned)
 01/20/2024 9:02 PM   Carmen Vazquez 24-Sep-1946 [id number]  Referring provider: Lorrie Rothman, MD [address] [place],  [place] [address]  Urological history: 1.  Recurrent UTI's -Contributing factors of age and vaginal atrophy -August 11, 2023 - Klebsiella pneumoniae -August 10, 2023 - Mixed urogenital flora  -Trimethoprim  100 mg daily, vaginal estrogen cream and cranberry  2.  Nephrolithiasis -CT (01/2022) right UVJ stone spontaneously passed  3. Renal cyst -CT (01/2022) - Benign Bosniak 1 renal cyst of the RIGHT kidney   4. High risk hematuria -non-smoker -CT pelvis (02/2022) -no worrisome findings -RUS (02/2022) -mild right sided hydronephrosis -CT (01/2022)  -right UVJ stone -cysto (08/2022) - NED  5. OAB -fesoterodine  8 mg daily   No chief complaint on file.  HPI: Carmen Vazquez is a 78 y.o. women who presents today f for 6-week follow-up  with her husband, [name]. after a trial of Toviaz  for her urgency.  Previous records reviewed.    They are having (1 to 7) or (8 or more) daytime voids,  they are having nocturia (1-2) or (3 or more) and urgency is (none, mild, strong, severe).   They are having (stress, urge or mixed incontinence.)    they are having urinary leakage (1-2 times weekly, 3 or more times weekly, 1-2 times daily and 3 or more times daily) They are using absorbent products for leakage (no, sometimes, always )   the type of products they use are (panty liners, absorbant pads, depends) *** daily.  They are not limiting fluids.  They are not engaging in toilet mapping  ***   PVR *** mL     PMH: Past Medical History:  Diagnosis Date   Anemia    Arthritis    Chronic GERD    Generalized anxiety disorder    Headache disorder    History of colonic polyps    Hyperlipidemia    Memory loss or impairment    Osteoporosis    PONV (postoperative nausea and vomiting)    Rotator cuff tear    Vertigo, benign positional, unspecified  laterality     Surgical History: Past Surgical History:  Procedure Laterality Date   ABDOMINAL HYSTERECTOMY     BCC REMOVAL     CARPAL TUNNEL RELEASE Right    COLONOSCOPY     COLONOSCOPY WITH PROPOFOL  N/A 06/07/2019   Procedure: COLONOSCOPY WITH PROPOFOL ;  Surgeon: Toledo, Alphonsus Jeans, MD;  Location: [facility];  Service: Gastroenterology;  Laterality: N/A;   ESOPHAGOGASTRODUODENOSCOPY (EGD) WITH PROPOFOL  N/A 11/30/2023   Procedure: ESOPHAGOGASTRODUODENOSCOPY (EGD) WITH PROPOFOL ;  Surgeon: Selena Daily, MD;  Location: [facility];  Service: Endoscopy;  Laterality: N/A;   ROTATOR CUFF SURGERY     THUMB TENDON RELEASE Right    TONSILLECTOMY      Home Medications:  Allergies as of 01/20/2024       Reactions   Codeine Itching   Statins Other (See Comments)   MUSCLE PAIN        Medication List        Accurate as of January 19, 2024  9:02 PM. If you have any questions, ask your nurse or doctor.          acetaminophen 500 MG tablet Commonly known as: TYLENOL Take 500 mg by mouth every 4 (four) hours as needed.   aspirin EC 81 MG tablet Take by mouth.   BEET ROOT PO Take by mouth.   cimetidine 200 MG tablet Commonly known as:  TAGAMET Take 200 mg by mouth at bedtime.   estradiol  0.1 MG/GM vaginal cream Commonly known as: ESTRACE  Apply one pea-sized amount around the opening of the urethra every Monday, Wednesday, and Friday.   fesoterodine  8 MG Tb24 tablet Commonly known as: TOVIAZ  Take 1 tablet (8 mg total) by mouth daily.   memantine 10 MG tablet Commonly known as: NAMENDA Take 1 tablet by mouth 2 (two) times daily.   multivitamin tablet Take 1 tablet by mouth daily.   naproxen 500 MG tablet Commonly known as: NAPROSYN Take 500 mg by mouth 2 (two) times daily with a meal.   Omega-3 1000 MG Caps Take 2,000 mg by mouth daily.   omeprazole  40 MG capsule Commonly known as: PRILOSEC Take 1 capsule (40 mg total) by mouth daily before  breakfast.   PREVAGEN PO Take by mouth.        Allergies:  Allergies  Allergen Reactions   Codeine Itching   Statins Other (See Comments)    MUSCLE PAIN     Family History: Family History  Problem Relation Age of Onset   Breast cancer Maternal Aunt     Social History:  reports that she has never smoked. She has never used smokeless tobacco. She reports that she does not drink alcohol and does not use drugs.  ROS: Pertinent ROS in HPI  Physical Exam: There were no vitals taken for this visit.  Constitutional:  Well nourished. Alert and oriented, No acute distress. HEENT: [place] AT, moist mucus membranes.  Trachea midline, no masses. Cardiovascular: No clubbing, cyanosis, or edema. Respiratory: Normal respiratory effort, no increased work of breathing. GU: No CVA tenderness.  No bladder fullness or masses.  Recession of labia minora, dry, pale vulvar vaginal mucosa and loss of mucosal ridges and folds.  Normal urethral meatus, no lesions, no prolapse, no discharge.   No urethral masses, tenderness and/or tenderness. No bladder fullness, tenderness or masses. *** vagina mucosa, *** estrogen effect, no discharge, no lesions, *** pelvic support, *** cystocele and *** rectocele noted.  No cervical motion tenderness.  Uterus is freely mobile and non-fixed.  No adnexal/parametria masses or tenderness noted.  Anus and perineum are without rashes or lesions.   ***  Neurologic: Grossly intact, no focal deficits, moving all 4 extremities. Psychiatric: Normal mood and affect.    Laboratory Data: Comprehensive Metabolic Panel (CMP) Order: [id number] Component Ref Range & Units 3 mo ago  Glucose 70 - 110 mg/dL 93  Sodium 865 - 784 mmol/L 141  Potassium 3.6 - 5.1 mmol/L 4.7  Chloride 97 - 109 mmol/L 110 High   Carbon Dioxide (CO2) 22.0 - 32.0 mmol/L 27.2  Urea Nitrogen (BUN) 7 - 25 mg/dL 17  Creatinine 0.6 - 1.1 mg/dL 0.6  Glomerular Filtration Rate (eGFR) >60 mL/min/1.73sq m 92   Comment: CKD-EPI (2021) does not include patient's race in the calculation of eGFR.  Monitoring changes of plasma creatinine and eGFR over time is useful for monitoring kidney function.  Interpretive Ranges for eGFR (CKD-EPI 2021):  eGFR:       >60 mL/min/1.73 sq. m - Normal eGFR:       30-59 mL/min/1.73 sq. m - Moderately Decreased eGFR:       15-29 mL/min/1.73 sq. m  - Severely Decreased eGFR:       < 15 mL/min/1.73 sq. m  - Kidney Failure   Note: These eGFR calculations do not apply in acute situations when eGFR is changing rapidly or patients on dialysis.  Calcium  8.7 - 10.3 mg/dL 9.4  AST 8 - 39 U/L 27  ALT 5 - 38 U/L 15  Alk Phos (alkaline Phosphatase) 34 - 104 U/L 52  Albumin 3.5 - 4.8 g/dL 4.1  Bilirubin, Total 0.3 - 1.2 mg/dL 0.9  Protein, Total 6.1 - 7.9 g/dL 6.2  A/G Ratio 1.0 - 5.0 gm/dL 2  Resulting Agency [facility]   Specimen Collected: 10/15/23 08:24   Performed by: Ivette Marks [facility] Last Resulted: 10/15/23 13:20  Received From: [facility]  Result Received: 10/15/23 14:24   Lipid Panel w/calc LDL Order: [id number] Component Ref Range & Units 3 mo ago  Cholesterol, Total 100 - 200 mg/dL 409 High   Triglyceride 35 - 199 mg/dL 70  HDL (High Density Lipoprotein) Cholesterol 35.0 - 85.0 mg/dL 81.1  LDL Calculated 0 - 130 mg/dL 914  VLDL Cholesterol mg/dL 14  Cholesterol/HDL Ratio 3.1  Resulting Agency [facility]   Specimen Collected: 10/15/23 08:24   Performed by: Ivette Marks [facility] Last Resulted: 10/15/23 13:22  Received From: [facility]  Result Received: 10/15/23 14:24  I have reviewed the labs.  See HPI.     Pertinent Imaging: ***  Assessment & Plan:    1. rUTI's -asymptomatic at this visit  2.  Urgency -They did not pick up the Toviaz  8 mg daily, so I do not know if it was effective, she still having the urgency  3.  Vaginal atrophy -continue  vaginal Estrace  cream to apply Monday, Wednesday and Friday    No follow-ups on file.  These notes generated with voice recognition software. I apologize for typographical errors.  Briant Camper  [facility] [address]  [address] [place], [place] [address] [phone]

## 2024-01-20 ENCOUNTER — Encounter: Payer: Self-pay | Admitting: Urology

## 2024-01-20 ENCOUNTER — Ambulatory Visit (INDEPENDENT_AMBULATORY_CARE_PROVIDER_SITE_OTHER): Payer: Self-pay | Admitting: Urology

## 2024-01-20 VITALS — BP 117/75 | HR 92 | Ht 64.0 in | Wt 120.0 lb

## 2024-01-20 DIAGNOSIS — R3915 Urgency of urination: Secondary | ICD-10-CM

## 2024-01-20 DIAGNOSIS — N390 Urinary tract infection, site not specified: Secondary | ICD-10-CM | POA: Diagnosis not present

## 2024-01-20 DIAGNOSIS — N3281 Overactive bladder: Secondary | ICD-10-CM | POA: Diagnosis not present

## 2024-01-20 DIAGNOSIS — N952 Postmenopausal atrophic vaginitis: Secondary | ICD-10-CM

## 2024-01-20 LAB — BLADDER SCAN AMB NON-IMAGING

## 2024-01-20 MED ORDER — FESOTERODINE FUMARATE ER 8 MG PO TB24: 8.0000 mg | ORAL_TABLET | Freq: Every day | ORAL | 4 refills | Status: DC

## 2024-01-20 MED ORDER — ESTRADIOL 0.1 MG/GM VA CREA: TOPICAL_CREAM | VAGINAL | 5 refills | Status: DC

## 2024-02-09 ENCOUNTER — Ambulatory Visit (INDEPENDENT_AMBULATORY_CARE_PROVIDER_SITE_OTHER): Admitting: Physician Assistant

## 2024-02-09 VITALS — BP 116/76 | HR 81 | Ht 61.0 in | Wt 120.0 lb

## 2024-02-09 DIAGNOSIS — N390 Urinary tract infection, site not specified: Secondary | ICD-10-CM

## 2024-02-09 LAB — URINALYSIS, COMPLETE
Bilirubin, UA: NEGATIVE
Glucose, UA: NEGATIVE
Ketones, UA: NEGATIVE
Nitrite, UA: NEGATIVE
Protein,UA: NEGATIVE
RBC, UA: NEGATIVE
Specific Gravity, UA: <1.005 — ABNORMAL LOW (ref 1.005–1.030)
Urobilinogen, Ur: 0.2 mg/dL (ref 0.2–1.0)
pH, UA: 6 (ref 5.0–7.5)

## 2024-02-09 LAB — MICROSCOPIC EXAMINATION: Epithelial Cells (non renal): >10 /HPF — AB (ref 0–10)

## 2024-02-09 MED ORDER — CEFUROXIME AXETIL 250 MG PO TABS
250.0000 mg | ORAL_TABLET | Freq: Two times a day (BID) | ORAL | 0 refills | Status: AC
Start: 2024-02-09 — End: 2024-02-14

## 2024-02-09 NOTE — Progress Notes (Signed)
 02/09/2024 1:00 PM   CERA PFOST June 01, 1946 [id number]  CC: Chief Complaint  Patient presents with   Dysuria   HPI: Carmen Vazquez is a 78 y.o. female with PMH recurrent UTI, nephrolithiasis, high risk hematuria with benign workup in 2023, and OAB on Toviaz  who presents today for evaluation of possible UTI.  She is accompanied today by her husband, who contributes to HPI.  Today she reports 2 weeks of dysuria and urethral pain.  She denies fever, chills, nausea, vomiting, flank pain, vaginal itching, or vaginal odor.  She took some ibuprofen to help with her symptoms, but it did not help.  In-office UA today positive for cloudy clarity and 1+ leukocytes; urine microscopy with 11-30 WBCs/HPF, >10 epithelial cells/hpf, and moderate bacteria.   PMH: Past Medical History:  Diagnosis Date   Anemia    Arthritis    Chronic GERD    Generalized anxiety disorder    Headache disorder    History of colonic polyps    Hyperlipidemia    Memory loss or impairment    Osteoporosis    PONV (postoperative nausea and vomiting)    Rotator cuff tear    Vertigo, benign positional, unspecified laterality     Surgical History: Past Surgical History:  Procedure Laterality Date   ABDOMINAL HYSTERECTOMY     BCC REMOVAL     CARPAL TUNNEL RELEASE Right    COLONOSCOPY     COLONOSCOPY WITH PROPOFOL  N/A 06/07/2019   Procedure: COLONOSCOPY WITH PROPOFOL ;  Surgeon: Toledo, Alphonsus Jeans, MD;  Location: [facility];  Service: Gastroenterology;  Laterality: N/A;   ESOPHAGOGASTRODUODENOSCOPY (EGD) WITH PROPOFOL  N/A 11/30/2023   Procedure: ESOPHAGOGASTRODUODENOSCOPY (EGD) WITH PROPOFOL ;  Surgeon: Selena Daily, MD;  Location[facility];  Service: Endoscopy;  Laterality: N/A;   ROTATOR CUFF SURGERY     THUMB TENDON RELEASE Right    TONSILLECTOMY      Home Medications:  Allergies as of 02/09/2024       Reactions   Codeine Itching   Statins Other (See Comments)   MUSCLE PAIN         Medication List        Accurate as of Feb 09, 2024  1:00 PM. If you have any questions, ask your nurse or doctor.          acetaminophen 500 MG tablet Commonly known as: TYLENOL Take 500 mg by mouth every 4 (four) hours as needed.   aspirin EC 81 MG tablet Take by mouth.   BEET ROOT PO Take by mouth.   cefUROXime  250 MG tablet Commonly known as: CEFTIN  Take 1 tablet (250 mg total) by mouth 2 (two) times daily with a meal for 5 days.   cimetidine 200 MG tablet Commonly known as: TAGAMET Take 200 mg by mouth at bedtime.   estradiol  0.1 MG/GM vaginal cream Commonly known as: ESTRACE  Apply one pea-sized amount around the opening of the urethra every Monday, Wednesday, and Friday.   fesoterodine  8 MG Tb24 tablet Commonly known as: TOVIAZ  Take 1 tablet (8 mg total) by mouth daily.   memantine 10 MG tablet Commonly known as: NAMENDA Take 1 tablet by mouth 2 (two) times daily.   multivitamin tablet Take 1 tablet by mouth daily.   naproxen 500 MG tablet Commonly known as: NAPROSYN Take 500 mg by mouth 2 (two) times daily with a meal.   Omega-3 1000 MG Caps Take 2,000 mg by mouth daily.   omeprazole  40 MG capsule Commonly known  as: PRILOSEC Take 1 capsule (40 mg total) by mouth daily before breakfast.   PREVAGEN PO Take by mouth.   rivastigmine 1.5 MG capsule Commonly known as: EXELON Take 1.5 mg daily for 1 week, then increase to 1.5 mg twice a day.        Allergies:  Allergies  Allergen Reactions   Codeine Itching   Statins Other (See Comments)    MUSCLE PAIN     Family History: Family History  Problem Relation Age of Onset   Breast cancer Maternal Aunt     Social History:   reports that she has never smoked. She has never used smokeless tobacco. She reports that she does not drink alcohol and does not use drugs.  Physical Exam: BP 116/76   Pulse 81   Ht 5\' 1"  (1.549 m)   Wt 120 lb (54.4 kg)   BMI 22.67 kg/m   Constitutional:   Alert and oriented, no acute distress, nontoxic appearing HEENT: [place], AT Cardiovascular: No clubbing, cyanosis, or edema Respiratory: Normal respiratory effort, no increased work of breathing GU: Exam chaperoned by Theodosia Fishman, CMA.  Sparse pubic hair.  Mild introital stenosis.  No urethral caruncle, erythema, skin breaks, or discharge. Skin: No rashes, bruises or suspicious lesions Neurologic: Grossly intact, no focal deficits, moving all 4 extremities Psychiatric: Normal mood and affect  Laboratory Data: Results for orders placed or performed in visit on 02/09/24  Microscopic Examination   Collection Time: 02/09/24 10:39 AM   Urine  Result Value Ref Range   WBC, UA 11-30 (A) 0 - 5 /hpf   RBC, Urine 0-2 0 - 2 /hpf   Epithelial Cells (non renal) >10 (A) 0 - 10 /hpf   Bacteria, UA Moderate (A) None seen/Few  Urinalysis, Complete   Collection Time: 02/09/24 10:39 AM  Result Value Ref Range   Specific Gravity, UA <1.005 (L) 1.005 - 1.030   pH, UA 6.0 5.0 - 7.5   Color, UA Yellow Yellow   Appearance Ur Slightly cloudy Clear   Leukocytes,UA 1+ (A) Negative   Protein,UA Negative Negative/Trace   Glucose, UA Negative Negative   Ketones, UA Negative Negative   RBC, UA Negative Negative   Bilirubin, UA Negative Negative   Urobilinogen, Ur 0.2 0.2 - 1.0 mg/dL   Nitrite, UA Negative Negative   Microscopic Examination See below:    Assessment & Plan:   1. Recurrent UTI (Primary) UA appears positive, though it is contaminated.  Will start empiric cefuroxime  and send for culture for further evaluation.  They request to start a daily suppressive antibiotic, will prescribe per culture results.  Pelvic exam with no significant findings other than anticipated changes associated with GSM. - Urinalysis, Complete - CULTURE, URINE COMPREHENSIVE - cefUROXime  (CEFTIN ) 250 MG tablet; Take 1 tablet (250 mg total) by mouth 2 (two) times daily with a meal for 5 days.  Dispense: 10 tablet; Refill:  0  Return for Will call to start suppressive abx per cx results.  Kathreen Pare, PA-C  [facility] [place] [address] [place], [place] [address] [phone]

## 2024-02-11 LAB — CULTURE, URINE COMPREHENSIVE

## 2024-02-12 ENCOUNTER — Ambulatory Visit: Payer: Self-pay | Admitting: Physician Assistant

## 2024-02-12 DIAGNOSIS — N390 Urinary tract infection, site not specified: Secondary | ICD-10-CM

## 2024-02-12 MED ORDER — CEFDINIR 300 MG PO CAPS: 300.0000 mg | ORAL_CAPSULE | Freq: Two times a day (BID) | ORAL | 0 refills | Status: AC

## 2024-02-12 MED ORDER — TRIMETHOPRIM 100 MG PO TABS: 100.0000 mg | ORAL_TABLET | Freq: Every day | ORAL | 0 refills | Status: DC

## 2024-05-29 ENCOUNTER — Other Ambulatory Visit: Payer: Self-pay | Admitting: Physician Assistant

## 2024-05-29 DIAGNOSIS — N390 Urinary tract infection, site not specified: Secondary | ICD-10-CM

## 2024-07-04 ENCOUNTER — Telehealth: Payer: Self-pay

## 2024-07-04 NOTE — Telephone Encounter (Signed)
 Pt husband called in with c/o pain with urination, Not burning. Pt was seen 1 week ago in urgent care for UTI complaints but was told it was not a UTI. Pt stated she's just having pain when she urinates. I offered a same day appt for tomorrow and husband was agreeable to that.

## 2024-07-05 ENCOUNTER — Ambulatory Visit (INDEPENDENT_AMBULATORY_CARE_PROVIDER_SITE_OTHER): Admitting: Physician Assistant

## 2024-07-05 VITALS — BP 119/60 | HR 84 | Ht 64.0 in | Wt 119.5 lb

## 2024-07-05 DIAGNOSIS — N3281 Overactive bladder: Secondary | ICD-10-CM | POA: Diagnosis not present

## 2024-07-05 DIAGNOSIS — N390 Urinary tract infection, site not specified: Secondary | ICD-10-CM

## 2024-07-05 LAB — URINALYSIS, COMPLETE
Bilirubin, UA: NEGATIVE
Glucose, UA: NEGATIVE
Ketones, UA: NEGATIVE
Nitrite, UA: POSITIVE — AB
Protein,UA: NEGATIVE
RBC, UA: NEGATIVE
Specific Gravity, UA: 1.015 (ref 1.005–1.030)
Urobilinogen, Ur: 0.2 mg/dL (ref 0.2–1.0)
pH, UA: 6 (ref 5.0–7.5)

## 2024-07-05 LAB — MICROSCOPIC EXAMINATION

## 2024-07-05 MED ORDER — ESTRADIOL 0.1 MG/GM VA CREA: TOPICAL_CREAM | VAGINAL | 5 refills | Status: AC

## 2024-07-05 NOTE — Progress Notes (Unsigned)
 07/05/2024 11:16 AM   Carmen Vazquez 07-Mar-1946 [id number]  CC: Chief Complaint  Patient presents with   Follow-up   Recurrent UTI   HPI: Carmen Vazquez is a 79 y.o. female with PMH recurrent UTI, nephrolithiasis, high risk hematuria with benign workup in 2023, and OAB on Toviaz  who presents today for evaluation of possible UTI.   Today she reports ***  In-office UA today positive for nitrites and 1+ leukocytes; urine microscopy with 11-30 WBCs/HPF and many bacteria.   PMH: Past Medical History:  Diagnosis Date   Anemia    Arthritis    Chronic GERD    Generalized anxiety disorder    Headache disorder    History of colonic polyps    Hyperlipidemia    Memory loss or impairment    Osteoporosis    PONV (postoperative nausea and vomiting)    Rotator cuff tear    Vertigo, benign positional, unspecified laterality     Surgical History: Past Surgical History:  Procedure Laterality Date   ABDOMINAL HYSTERECTOMY     BCC REMOVAL     CARPAL TUNNEL RELEASE Right    COLONOSCOPY     COLONOSCOPY WITH PROPOFOL  N/A 06/07/2019   Procedure: COLONOSCOPY WITH PROPOFOL ;  Surgeon: Toledo, Ladell POUR, MD;  Location: [facility];  Service: Gastroenterology;  Laterality: N/A;   ESOPHAGOGASTRODUODENOSCOPY (EGD) WITH PROPOFOL  N/A 11/30/2023   Procedure: ESOPHAGOGASTRODUODENOSCOPY (EGD) WITH PROPOFOL ;  Surgeon: Unk Corinn Skiff, MD;  Location: [facility];  Service: Endoscopy;  Laterality: N/A;   ROTATOR CUFF SURGERY     THUMB TENDON RELEASE Right    TONSILLECTOMY      Home Medications:  Allergies as of 07/05/2024       Reactions   Codeine Itching   Statins Other (See Comments)   MUSCLE PAIN        Medication List        Accurate as of July 05, 2024 11:16 AM. If you have any questions, ask your nurse or doctor.          acetaminophen 500 MG tablet Commonly known as: TYLENOL Take 500 mg by mouth every 4 (four) hours as needed.   aspirin EC 81 MG  tablet Take by mouth.   BEET ROOT PO Take by mouth.   cimetidine 200 MG tablet Commonly known as: TAGAMET Take 200 mg by mouth at bedtime.   estradiol  0.1 MG/GM vaginal cream Commonly known as: ESTRACE  Apply one pea-sized amount around the opening of the urethra every Monday, Wednesday, and Friday.   fesoterodine  8 MG Tb24 tablet Commonly known as: TOVIAZ  Take 1 tablet (8 mg total) by mouth daily.   memantine 10 MG tablet Commonly known as: NAMENDA Take 1 tablet by mouth 2 (two) times daily.   multivitamin tablet Take 1 tablet by mouth daily.   naproxen 500 MG tablet Commonly known as: NAPROSYN Take 500 mg by mouth 2 (two) times daily with a meal.   Omega-3 1000 MG Caps Take 2,000 mg by mouth daily.   omeprazole  40 MG capsule Commonly known as: PRILOSEC Take 1 capsule (40 mg total) by mouth daily before breakfast.   PREVAGEN PO Take by mouth.   rivastigmine 1.5 MG capsule Commonly known as: EXELON Take 1.5 mg daily for 1 week, then increase to 1.5 mg twice a day.   trimethoprim  100 MG tablet Commonly known as: TRIMPEX  TAKE 1 TABLET BY MOUTH ONCE DAILY        Allergies:  Allergies  Allergen Reactions  Codeine Itching   Statins Other (See Comments)    MUSCLE PAIN     Family History: Family History  Problem Relation Age of Onset   Breast cancer Maternal Aunt     Social History:   reports that she has never smoked. She has never used smokeless tobacco. She reports that she does not drink alcohol and does not use drugs.  Physical Exam: BP 119/60 (BP Location: Left Arm, Patient Position: Sitting, Cuff Size: Normal)   Pulse 84   Ht 5' 4 (1.626 m)   Wt 119 lb 8 oz (54.2 kg)   SpO2 97%   BMI 20.51 kg/m   Constitutional:  Alert and oriented, no acute distress, nontoxic appearing HEENT: [place], AT Cardiovascular: No clubbing, cyanosis, or edema Respiratory: Normal respiratory effort, no increased work of breathing GI: Abdomen is soft, nontender,  nondistended, no abdominal masses GU: No CVA tenderness Lymph: No cervical or inguinal lymphadenopathy Skin: No rashes, bruises or suspicious lesions Neurologic: Grossly intact, no focal deficits, moving all 4 extremities Psychiatric: Normal mood and affect  Laboratory Data: Lab Results  Component Value Date   WBC 10.4 09/01/2023   HGB 13.2 09/01/2023   HCT 41.5 09/01/2023   MCV 98.8 09/01/2023   PLT 274 09/01/2023    Lab Results  Component Value Date   CREATININE 0.59 09/01/2023    CrCl cannot be calculated (Patient's most recent lab result is older than the maximum 21 days allowed.).  Results for orders placed or performed in visit on 02/09/24  Microscopic Examination   Collection Time: 02/09/24 10:39 AM   Urine  Result Value Ref Range   WBC, UA 11-30 (A) 0 - 5 /hpf   RBC, Urine 0-2 0 - 2 /hpf   Epithelial Cells (non renal) >10 (A) 0 - 10 /hpf   Bacteria, UA Moderate (A) None seen/Few  Urinalysis, Complete   Collection Time: 02/09/24 10:39 AM  Result Value Ref Range   Specific Gravity, UA <1.005 (L) 1.005 - 1.030   pH, UA 6.0 5.0 - 7.5   Color, UA Yellow Yellow   Appearance Ur Slightly cloudy Clear   Leukocytes,UA 1+ (A) Negative   Protein,UA Negative Negative/Trace   Glucose, UA Negative Negative   Ketones, UA Negative Negative   RBC, UA Negative Negative   Bilirubin, UA Negative Negative   Urobilinogen, Ur 0.2 0.2 - 1.0 mg/dL   Nitrite, UA Negative Negative   Microscopic Examination See below:   CULTURE, URINE COMPREHENSIVE   Collection Time: 02/09/24 11:20 AM   Specimen: Urine   UR  Result Value Ref Range   Urine Culture, Comprehensive Final report (A)    Organism ID, Bacteria Klebsiella aerogenes (A)    ANTIMICROBIAL SUSCEPTIBILITY Comment     Pertinent Imaging: KUB, ***: *** Results for orders placed during the hospital encounter of 02/28/22  Abdomen 1 view (KUB)  Narrative CLINICAL DATA:  Right ureteral calculus.  EXAM: ABDOMEN - 1  VIEW  COMPARISON:  CT of the abdomen pelvis 02/14/2022  FINDINGS: No stones project over the renal shadows. Pelvic phleboliths are present without definite distal ureteral stone. Bowel gas pattern is unremarkable. Multilevel degenerative changes are present in the lumbar spine.  IMPRESSION: 1. No definite renal or distal ureteral stone. 2. Pelvic phleboliths.   Electronically Signed By: Lonni Necessary M.D. On: 03/02/2022 07:22  No results found for this or any previous visit.  No results found for this or any previous visit.  No results found for this or any  previous visit.  Results for orders placed during the hospital encounter of 03/18/22  Ultrasound renal complete  Narrative CLINICAL DATA:  Hydronephrosis with urinary obstruction.  EXAM: RENAL / URINARY TRACT ULTRASOUND COMPLETE  COMPARISON:  CT abdomen dated 02/14/2022.  FINDINGS: Right Kidney:  Renal measurements: 10.7 x 6.4 x 5.7 cm = volume: 204 mL. Cortical echogenicity within normal limits. 2 benign cysts, largest measuring 4 cm. No suspicious mass. Mild hydronephrosis.  Left Kidney:  Renal measurements: 10.9 x 5.5 x 5.4 cm = volume: 172 mL. Echogenicity within normal limits. No mass or hydronephrosis visualized.  Bladder:  Appears normal for degree of bladder distention. Prevoid volume measures 113 cc. No significant postvoid residual (approximate 12 cc).  Other:  Both distal ureters are shown to be patent at the level of the bladder (bilateral ureteral jets visualized).  IMPRESSION: 1. Mild RIGHT-sided hydronephrosis, not significantly changed compared to the degree of hydronephrosis demonstrated on CT abdomen of 02/14/2022. 2. Both distal ureters are shown to be patent at the level of the bladder (bilateral ureteral jets visualized). 3. Minimal postvoid residual in the bladder.   Electronically Signed By: Lael Hines M.D. On: 03/19/2022 08:03  No results found for this or  any previous visit.  No results found for this or any previous visit.  Results for orders placed during the hospital encounter of 08/19/23  CT RENAL STONE STUDY  Narrative CLINICAL DATA:  Flank pain.  Dysuria.  Nephrolithiasis.  EXAM: CT ABDOMEN AND PELVIS WITHOUT CONTRAST  TECHNIQUE: Multidetector CT imaging of the abdomen and pelvis was performed following the standard protocol without IV contrast.  RADIATION DOSE REDUCTION: This exam was performed according to the departmental dose-optimization program which includes automated exposure control, adjustment of the mA and/or kV according to patient size and/or use of iterative reconstruction technique.  COMPARISON:  02/14/2022  FINDINGS: Lower chest: No acute findings.  Hepatobiliary: No mass visualized on this unenhanced exam. Gallbladder is unremarkable. No evidence of biliary ductal dilatation.  Pancreas: No mass or inflammatory process visualized on this unenhanced exam.  Spleen:  Within normal limits in size.  Adrenals/Urinary tract: No evidence of urolithiasis or hydronephrosis. Unremarkable unopacified urinary bladder.  Stomach/Bowel: No evidence of obstruction, inflammatory process, or abnormal fluid collections. Diverticulosis is seen mainly involving the sigmoid colon, however there is no evidence of diverticulitis. Normal appendix visualized.  Vascular/Lymphatic: No pathologically enlarged lymph nodes identified. No evidence of abdominal aortic aneurysm.  Reproductive: Prior hysterectomy noted. Adnexal regions are unremarkable in appearance.  Other:  None.  Musculoskeletal:  No suspicious bone lesions identified.  IMPRESSION: No evidence of urolithiasis, hydronephrosis, or other acute findings.  Colonic diverticulosis, without radiographic evidence of diverticulitis.   Electronically Signed By: Norleen DELENA Kil M.D. On: 08/28/2023 15:18   I personally reviewed the images referenced above and  note ***.  Assessment & Plan:   1. Recurrent UTI (Primary) *** - Urinalysis, Complete   No follow-ups on file.  Lucie Hones, PA-C  [facility] [place] [address], [address] [place], [place] [address] [phone]

## 2024-07-08 ENCOUNTER — Telehealth: Payer: Self-pay | Admitting: Physician Assistant

## 2024-07-08 DIAGNOSIS — N3281 Overactive bladder: Secondary | ICD-10-CM

## 2024-07-08 MED ORDER — TROSPIUM CHLORIDE 20 MG PO TABS: 20.0000 mg | ORAL_TABLET | Freq: Two times a day (BID) | ORAL | 11 refills | Status: DC

## 2024-07-08 NOTE — Telephone Encounter (Signed)
 I would like to stop her Toviaz  given her memory issues.  I am sending in trospium as an alternative.  Please schedule her for symptom recheck and PVR with me in 6 weeks.

## 2024-07-11 NOTE — Telephone Encounter (Signed)
 3rd phone call attempted. Patient scheduled for follow-up with Sam 11/21 at [facility] phone and cell phone attempted. Voicemail left for patient to return call to clinic.

## 2024-07-11 NOTE — Telephone Encounter (Signed)
 2nd phone call attempted. Voicemail left for patient to return call to clinic.

## 2024-07-11 NOTE — Telephone Encounter (Signed)
 1st phone call attempted. Unable to leave voicemail.

## 2024-07-12 ENCOUNTER — Ambulatory Visit: Payer: Self-pay | Admitting: Physician Assistant

## 2024-07-12 DIAGNOSIS — N390 Urinary tract infection, site not specified: Secondary | ICD-10-CM

## 2024-07-12 DIAGNOSIS — R3989 Other symptoms and signs involving the genitourinary system: Secondary | ICD-10-CM

## 2024-07-12 LAB — CULTURE, URINE COMPREHENSIVE

## 2024-07-12 MED ORDER — AMOXICILLIN-POT CLAVULANATE 875-125 MG PO TABS: 1.0000 | ORAL_TABLET | Freq: Two times a day (BID) | ORAL | 0 refills | Status: DC

## 2024-07-13 MED ORDER — AMOXICILLIN-POT CLAVULANATE 875-125 MG PO TABS: 1.0000 | ORAL_TABLET | Freq: Two times a day (BID) | ORAL | 0 refills | Status: AC

## 2024-07-13 NOTE — Addendum Note (Signed)
 Addended by: CLEOTILDE HELLER L on: 07/13/2024 08:28 AM   Modules accepted: Orders

## 2024-08-19 ENCOUNTER — Ambulatory Visit (INDEPENDENT_AMBULATORY_CARE_PROVIDER_SITE_OTHER): Admitting: Physician Assistant

## 2024-08-19 VITALS — BP 129/80 | HR 85 | Ht 64.0 in | Wt 117.3 lb

## 2024-08-19 DIAGNOSIS — N3281 Overactive bladder: Secondary | ICD-10-CM | POA: Diagnosis not present

## 2024-08-19 DIAGNOSIS — N390 Urinary tract infection, site not specified: Secondary | ICD-10-CM | POA: Diagnosis not present

## 2024-08-19 LAB — BLADDER SCAN AMB NON-IMAGING: Scan Result: 22

## 2024-08-19 NOTE — Progress Notes (Signed)
 08/19/2024 11:27 AM   Carmen Vazquez December 17, 1945 [id number]  CC: Chief Complaint  Patient presents with   Medical Management of Chronic Issues   HPI: Carmen Vazquez is a 78 y.o. female with PMH recurrent UTI on suppressive trimethoprim , nephrolithiasis, high risk hematuria with benign workup in 2023, cognitive impairment, and OAB who presents today for symptom recheck after switching from Toviaz  to trospium .  She is accompanied today by her husband, who contributes to HPI.  Today they report her urinary symptoms are largely stable after switching to trospium .  She has chronic constipation, which has been worse in the past several years, but they have not noticed any acute changes.  Unclear if she is having any urge incontinence: She denies this, but he states she is having a little bit of it.  PVR 22 mL.  PMH: Past Medical History:  Diagnosis Date   Anemia    Arthritis    Chronic GERD    Generalized anxiety disorder    Headache disorder    History of colonic polyps    Hyperlipidemia    Memory loss or impairment    Osteoporosis    PONV (postoperative nausea and vomiting)    Rotator cuff tear    Vertigo, benign positional, unspecified laterality     Surgical History: Past Surgical History:  Procedure Laterality Date   ABDOMINAL HYSTERECTOMY     BCC REMOVAL     CARPAL TUNNEL RELEASE Right    COLONOSCOPY     COLONOSCOPY WITH PROPOFOL  N/A 06/07/2019   Procedure: COLONOSCOPY WITH PROPOFOL ;  Surgeon: Toledo, Ladell POUR, MD;  Location: [facility];  Service: Gastroenterology;  Laterality: N/A;   ESOPHAGOGASTRODUODENOSCOPY (EGD) WITH PROPOFOL  N/A 11/30/2023   Procedure: ESOPHAGOGASTRODUODENOSCOPY (EGD) WITH PROPOFOL ;  Surgeon: Unk Corinn Skiff, MD;  Location: [facility];  Service: Endoscopy;  Laterality: N/A;   ROTATOR CUFF SURGERY     THUMB TENDON RELEASE Right    TONSILLECTOMY      Home Medications:  Allergies as of 08/19/2024       Reactions   Codeine  Itching   Statins Other (See Comments)   MUSCLE PAIN        Medication List        Accurate as of August 19, 2024 11:27 AM. If you have any questions, ask your nurse or doctor.          acetaminophen 500 MG tablet Commonly known as: TYLENOL Take 500 mg by mouth every 4 (four) hours as needed.   amoxicillin -clavulanate 875-125 MG tablet Commonly known as: AUGMENTIN  Take 1 tablet by mouth every 12 (twelve) hours.   aspirin EC 81 MG tablet Take by mouth.   BEET ROOT PO Take by mouth.   cimetidine 200 MG tablet Commonly known as: TAGAMET Take 200 mg by mouth at bedtime.   estradiol  0.1 MG/GM vaginal cream Commonly known as: ESTRACE  Apply one pea-sized amount around the opening of the urethra every Monday, Wednesday, and Friday.   memantine 10 MG tablet Commonly known as: NAMENDA Take 1 tablet by mouth 2 (two) times daily.   multivitamin tablet Take 1 tablet by mouth daily.   naproxen 500 MG tablet Commonly known as: NAPROSYN Take 500 mg by mouth 2 (two) times daily with a meal.   Omega-3 1000 MG Caps Take 2,000 mg by mouth daily.   omeprazole  40 MG capsule Commonly known as: PRILOSEC Take 1 capsule (40 mg total) by mouth daily before breakfast.   PREVAGEN PO Take by  mouth.   rivastigmine 1.5 MG capsule Commonly known as: EXELON Take 1.5 mg daily for 1 week, then increase to 1.5 mg twice a day.   trimethoprim  100 MG tablet Commonly known as: TRIMPEX  TAKE 1 TABLET BY MOUTH ONCE DAILY   trospium  20 MG tablet Commonly known as: SANCTURA  Take 1 tablet (20 mg total) by mouth 2 (two) times daily.        Allergies:  Allergies  Allergen Reactions   Codeine Itching   Statins Other (See Comments)    MUSCLE PAIN     Family History: Family History  Problem Relation Age of Onset   Breast cancer Maternal Aunt     Social History:   reports that she has never smoked. She has never used smokeless tobacco. She reports that she does not drink  alcohol and does not use drugs.  Physical Exam: BP 129/80   Pulse 85   Ht 5' 4 (1.626 m)   Wt 117 lb 4.8 oz (53.2 kg)   SpO2 97%   BMI 20.13 kg/m   Constitutional:  Alert and oriented, no acute distress, nontoxic appearing HEENT: [place], AT Cardiovascular: No clubbing, cyanosis, or edema Respiratory: Normal respiratory effort, no increased work of breathing Skin: No rashes, bruises or suspicious lesions Neurologic: Grossly intact, no focal deficits, moving all 4 extremities Psychiatric: Normal mood and affect  Laboratory Data: Results for orders placed or performed in visit on 08/19/24  Bladder Scan (Post Void Residual) in office   Collection Time: 08/19/24  8:58 AM  Result Value Ref Range   Scan Result 22    Assessment & Plan:   1. OAB (overactive bladder) (Primary) Stable urgency after switching to trospium .  Other antimuscarinics are contraindicated in the setting of her cognitive impairment.  She previously did well on beta 3 agonist, but there were issues with cost/insurance coverage.  They suspect they may be changing their insurance plan in the new year, so we will keep her on trospium  for now and see her back in January to see if the coverage has changed and we can switch her back to a beta 3 agonist, which is overall safer from a cognitive perspective and less likely to contribute to constipation. - Bladder Scan (Post Void Residual) in office  2. Recurrent UTI Continue suppressive trimethoprim .  Return in about 2 months (around 10/19/2024) for Follow up.  Lucie Hones, PA-C  [facility] [place] [address], [address] [place], [place] [address] [phone]

## 2024-08-25 ENCOUNTER — Other Ambulatory Visit: Payer: Self-pay | Admitting: Physician Assistant

## 2024-08-25 DIAGNOSIS — N390 Urinary tract infection, site not specified: Secondary | ICD-10-CM

## 2024-10-19 ENCOUNTER — Ambulatory Visit: Admitting: Physician Assistant

## 2024-10-19 VITALS — BP 125/79 | HR 83

## 2024-10-19 DIAGNOSIS — N3281 Overactive bladder: Secondary | ICD-10-CM

## 2024-10-19 DIAGNOSIS — N390 Urinary tract infection, site not specified: Secondary | ICD-10-CM

## 2024-10-19 LAB — BLADDER SCAN AMB NON-IMAGING: Scan Result: 1

## 2024-10-19 MED ORDER — MIRABEGRON ER 25 MG PO TB24: 25.0000 mg | ORAL_TABLET | Freq: Every day | ORAL | 3 refills | Status: AC

## 2024-10-19 MED ORDER — TRIMETHOPRIM 100 MG PO TABS: 100.0000 mg | ORAL_TABLET | Freq: Every day | ORAL | 3 refills | Status: AC

## 2024-10-19 NOTE — Progress Notes (Signed)
 "  10/19/2024 9:15 AM   Carmen Vazquez Mar 19, 1946 [id number]  CC: Chief Complaint  Patient presents with   Over Active Bladder   HPI: LYNNOX GIRTEN is a 79 y.o. female with PMH recurrent UTI on suppressive trimethoprim , nephrolithiasis, high risk hematuria with benign workup in 2023, cognitive impairment, and OAB on trospium  who previously had cost issues with beta 3 agonists who presents today for follow-up.  She is accompanied today by her husband, Norleen, who contributes to HPI.  Today she reports ongoing urgency and urge incontinence despite trospium .  There health insurance coverage changed with new year, now on [employer].  She remains on suppressive trimethoprim  and has had no UTIs since her last office visit.  PVR 1 mL.  PMH: Past Medical History:  Diagnosis Date   Anemia    Arthritis    Chronic GERD    Generalized anxiety disorder    Headache disorder    History of colonic polyps    Hyperlipidemia    Memory loss or impairment    Osteoporosis    PONV (postoperative nausea and vomiting)    Rotator cuff tear    Vertigo, benign positional, unspecified laterality     Surgical History: Past Surgical History:  Procedure Laterality Date   ABDOMINAL HYSTERECTOMY     BCC REMOVAL     CARPAL TUNNEL RELEASE Right    COLONOSCOPY     COLONOSCOPY WITH PROPOFOL  N/A 06/07/2019   Procedure: COLONOSCOPY WITH PROPOFOL ;  Surgeon: Toledo, Ladell POUR, MD;  Location: [facility];  Service: Gastroenterology;  Laterality: N/A;   ESOPHAGOGASTRODUODENOSCOPY (EGD) WITH PROPOFOL  N/A 11/30/2023   Procedure: ESOPHAGOGASTRODUODENOSCOPY (EGD) WITH PROPOFOL ;  Surgeon: Unk Corinn Skiff, MD;  Location: [facility];  Service: Endoscopy;  Laterality: N/A;   ROTATOR CUFF SURGERY     THUMB TENDON RELEASE Right    TONSILLECTOMY      Home Medications:  Allergies as of 10/19/2024       Reactions   Codeine Itching   Statins Other (See Comments)   MUSCLE PAIN         Medication List        Accurate as of October 19, 2024  9:15 AM. If you have any questions, ask your nurse or doctor.          acetaminophen 500 MG tablet Commonly known as: TYLENOL Take 500 mg by mouth every 4 (four) hours as needed.   amoxicillin -clavulanate 875-125 MG tablet Commonly known as: AUGMENTIN  Take 1 tablet by mouth every 12 (twelve) hours.   aspirin EC 81 MG tablet Take by mouth.   BEET ROOT PO Take by mouth.   cimetidine 200 MG tablet Commonly known as: TAGAMET Take 200 mg by mouth at bedtime.   estradiol  0.1 MG/GM vaginal cream Commonly known as: ESTRACE  Apply one pea-sized amount around the opening of the urethra every Monday, Wednesday, and Friday.   memantine 10 MG tablet Commonly known as: NAMENDA Take 1 tablet by mouth 2 (two) times daily.   multivitamin tablet Take 1 tablet by mouth daily.   naproxen 500 MG tablet Commonly known as: NAPROSYN Take 500 mg by mouth 2 (two) times daily with a meal.   Omega-3 1000 MG Caps Take 2,000 mg by mouth daily.   omeprazole  40 MG capsule Commonly known as: PRILOSEC Take 1 capsule (40 mg total) by mouth daily before breakfast.   PREVAGEN PO Take by mouth.   PRILOSEC PO Take 20 mg by mouth as needed.  rivastigmine 1.5 MG capsule Commonly known as: EXELON Take 1.5 mg daily for 1 week, then increase to 1.5 mg twice a day.   trimethoprim  100 MG tablet Commonly known as: TRIMPEX  TAKE 1 TABLET BY MOUTH ONCE DAILY   trospium  20 MG tablet Commonly known as: SANCTURA  Take 1 tablet (20 mg total) by mouth 2 (two) times daily.        Allergies:  Allergies[1]  Family History: Family History  Problem Relation Age of Onset   Breast cancer Maternal Aunt     Social History:   reports that she has never smoked. She has never used smokeless tobacco. She reports that she does not drink alcohol and does not use drugs.  Physical Exam: BP 125/79   Pulse 83   SpO2 97%   Constitutional:  Alert,  no acute distress, nontoxic appearing HEENT: [place], AT Cardiovascular: No clubbing, cyanosis, or edema Respiratory: Normal respiratory effort, no increased work of breathing Skin: No rashes, bruises or suspicious lesions Neurologic: Grossly intact, no focal deficits, moving all 4 extremities Psychiatric: Normal mood and affect  Laboratory Data: Results for orders placed or performed in visit on 10/19/24  Bladder Scan (Post Void Residual) in office   Collection Time: 10/19/24  9:11 AM  Result Value Ref Range   Scan Result 1    Assessment & Plan:   1. OAB (overactive bladder) (Primary) She has failed trospium  due to inefficacy and other antimuscarinics are contraindicated due to her cognitive impairment.  Will switch her back to Myrbetriq  25 mg, may need to request a tier exception with her new insurance for this. - Bladder Scan (Post Void Residual) in office - mirabegron  ER (MYRBETRIQ ) 25 MG TB24 tablet; Take 1 tablet (25 mg total) by mouth daily.  Dispense: 90 tablet; Refill: 3  2. Recurrent UTI Continue suppressive trimethoprim , infection free with this. - trimethoprim  (TRIMPEX ) 100 MG tablet; Take 1 tablet (100 mg total) by mouth daily.  Dispense: 90 tablet; Refill: 3   Return in about 6 months (around 04/18/2025) for Follow-up with [facility].  Lucie Hones, PA-C  [facility] [place] [address] [place], [place] [address] [phone]      [1]  Allergies Allergen Reactions   Codeine Itching   Statins Other (See Comments)    MUSCLE PAIN    "

## 2025-01-19 ENCOUNTER — Ambulatory Visit: Admitting: Urology

## 2025-03-27 ENCOUNTER — Ambulatory Visit: Admitting: Urology
